# Patient Record
Sex: Female | Born: 1935 | Race: White | Hispanic: No | Smoking: Never smoker
Health system: Southern US, Community
[De-identification: ages and names within clinical notes are randomized; demographics above are authoritative.]

## PROBLEM LIST (undated history)

## (undated) DIAGNOSIS — B019 Varicella without complication: Secondary | ICD-10-CM

## (undated) DIAGNOSIS — N390 Urinary tract infection, site not specified: Secondary | ICD-10-CM

## (undated) DIAGNOSIS — T7840XA Allergy, unspecified, initial encounter: Secondary | ICD-10-CM

## (undated) DIAGNOSIS — F329 Major depressive disorder, single episode, unspecified: Secondary | ICD-10-CM

## (undated) DIAGNOSIS — E785 Hyperlipidemia, unspecified: Secondary | ICD-10-CM

## (undated) DIAGNOSIS — M1990 Unspecified osteoarthritis, unspecified site: Secondary | ICD-10-CM

## (undated) DIAGNOSIS — F32A Depression, unspecified: Secondary | ICD-10-CM

## (undated) DIAGNOSIS — K219 Gastro-esophageal reflux disease without esophagitis: Secondary | ICD-10-CM

## (undated) DIAGNOSIS — C4491 Basal cell carcinoma of skin, unspecified: Secondary | ICD-10-CM

## (undated) HISTORY — PX: OTHER SURGICAL HISTORY: SHX169

## (undated) HISTORY — DX: Urinary tract infection, site not specified: N39.0

## (undated) HISTORY — DX: Basal cell carcinoma of skin, unspecified: C44.91

## (undated) HISTORY — PX: ABDOMINAL HYSTERECTOMY: SHX81

## (undated) HISTORY — PX: APPENDECTOMY: SHX54

## (undated) HISTORY — PX: CHOLECYSTECTOMY: SHX55

## (undated) HISTORY — DX: Unspecified osteoarthritis, unspecified site: M19.90

## (undated) HISTORY — DX: Major depressive disorder, single episode, unspecified: F32.9

## (undated) HISTORY — DX: Depression, unspecified: F32.A

## (undated) HISTORY — PX: TONSILLECTOMY: SUR1361

## (undated) HISTORY — DX: Gastro-esophageal reflux disease without esophagitis: K21.9

## (undated) HISTORY — DX: Hyperlipidemia, unspecified: E78.5

## (undated) HISTORY — DX: Varicella without complication: B01.9

## (undated) HISTORY — DX: Allergy, unspecified, initial encounter: T78.40XA

---

## 1998-10-17 ENCOUNTER — Ambulatory Visit (HOSPITAL_COMMUNITY): Admission: RE | Admit: 1998-10-17 | Discharge: 1998-10-17 | Payer: Self-pay | Admitting: *Deleted

## 1998-10-17 ENCOUNTER — Encounter: Payer: Self-pay | Admitting: *Deleted

## 1998-11-12 ENCOUNTER — Other Ambulatory Visit: Admission: RE | Admit: 1998-11-12 | Discharge: 1998-11-12 | Payer: Self-pay | Admitting: *Deleted

## 1999-11-03 ENCOUNTER — Encounter: Payer: Self-pay | Admitting: *Deleted

## 1999-11-03 ENCOUNTER — Ambulatory Visit (HOSPITAL_COMMUNITY): Admission: RE | Admit: 1999-11-03 | Discharge: 1999-11-03 | Payer: Self-pay | Admitting: *Deleted

## 1999-11-16 ENCOUNTER — Other Ambulatory Visit: Admission: RE | Admit: 1999-11-16 | Discharge: 1999-11-16 | Payer: Self-pay | Admitting: *Deleted

## 2000-10-17 ENCOUNTER — Ambulatory Visit (HOSPITAL_COMMUNITY): Admission: RE | Admit: 2000-10-17 | Discharge: 2000-10-17 | Payer: Self-pay | Admitting: *Deleted

## 2000-10-17 ENCOUNTER — Encounter: Payer: Self-pay | Admitting: *Deleted

## 2000-11-17 ENCOUNTER — Other Ambulatory Visit: Admission: RE | Admit: 2000-11-17 | Discharge: 2000-11-17 | Payer: Self-pay | Admitting: *Deleted

## 2001-10-18 ENCOUNTER — Encounter: Payer: Self-pay | Admitting: *Deleted

## 2001-10-18 ENCOUNTER — Ambulatory Visit (HOSPITAL_COMMUNITY): Admission: RE | Admit: 2001-10-18 | Discharge: 2001-10-18 | Payer: Self-pay | Admitting: *Deleted

## 2001-11-06 ENCOUNTER — Other Ambulatory Visit: Admission: RE | Admit: 2001-11-06 | Discharge: 2001-11-06 | Payer: Self-pay | Admitting: *Deleted

## 2002-10-19 ENCOUNTER — Ambulatory Visit (HOSPITAL_COMMUNITY): Admission: RE | Admit: 2002-10-19 | Discharge: 2002-10-19 | Payer: Self-pay | Admitting: *Deleted

## 2002-10-19 ENCOUNTER — Encounter: Payer: Self-pay | Admitting: *Deleted

## 2003-10-21 ENCOUNTER — Ambulatory Visit (HOSPITAL_COMMUNITY): Admission: RE | Admit: 2003-10-21 | Discharge: 2003-10-21 | Payer: Self-pay | Admitting: Family Medicine

## 2004-10-16 ENCOUNTER — Emergency Department (HOSPITAL_COMMUNITY): Admission: EM | Admit: 2004-10-16 | Discharge: 2004-10-16 | Payer: Self-pay | Admitting: Emergency Medicine

## 2004-11-24 ENCOUNTER — Ambulatory Visit (HOSPITAL_COMMUNITY): Admission: RE | Admit: 2004-11-24 | Discharge: 2004-11-24 | Payer: Self-pay | Admitting: Family Medicine

## 2005-02-25 ENCOUNTER — Emergency Department (HOSPITAL_COMMUNITY): Admission: EM | Admit: 2005-02-25 | Discharge: 2005-02-25 | Payer: Self-pay | Admitting: Emergency Medicine

## 2005-09-22 ENCOUNTER — Other Ambulatory Visit: Admission: RE | Admit: 2005-09-22 | Discharge: 2005-09-22 | Payer: Self-pay | Admitting: Family Medicine

## 2005-10-04 ENCOUNTER — Encounter: Payer: Self-pay | Admitting: Gastroenterology

## 2005-11-05 ENCOUNTER — Encounter: Payer: Self-pay | Admitting: Gastroenterology

## 2005-11-25 ENCOUNTER — Ambulatory Visit (HOSPITAL_COMMUNITY): Admission: RE | Admit: 2005-11-25 | Discharge: 2005-11-25 | Payer: Self-pay | Admitting: Family Medicine

## 2006-12-01 ENCOUNTER — Ambulatory Visit (HOSPITAL_COMMUNITY): Admission: RE | Admit: 2006-12-01 | Discharge: 2006-12-01 | Payer: Self-pay | Admitting: Family Medicine

## 2007-02-02 ENCOUNTER — Ambulatory Visit (HOSPITAL_COMMUNITY): Admission: RE | Admit: 2007-02-02 | Discharge: 2007-02-02 | Payer: Self-pay | Admitting: Family Medicine

## 2007-02-13 ENCOUNTER — Other Ambulatory Visit: Admission: RE | Admit: 2007-02-13 | Discharge: 2007-02-13 | Payer: Self-pay | Admitting: Interventional Radiology

## 2007-02-13 ENCOUNTER — Encounter: Admission: RE | Admit: 2007-02-13 | Discharge: 2007-02-13 | Payer: Self-pay | Admitting: Family Medicine

## 2007-02-13 ENCOUNTER — Encounter (INDEPENDENT_AMBULATORY_CARE_PROVIDER_SITE_OTHER): Payer: Self-pay | Admitting: *Deleted

## 2007-12-04 ENCOUNTER — Ambulatory Visit (HOSPITAL_COMMUNITY): Admission: RE | Admit: 2007-12-04 | Discharge: 2007-12-04 | Payer: Self-pay | Admitting: Family Medicine

## 2008-06-28 IMAGING — US US BIOPSY
1 series · 12 of 12 positions shown · non-contrast
Comparison: none

CLINICAL DATA: Right thyroid cystic nodule with hoarseness.  Request has been made for fine needle aspiration. 
 ULTRASOUND GUIDED FINE NEEDLE ASPIRATION, RIGHT LOBE OF THYROID:

[Series 1: us biopsy · 12 acquisitions, 12 frames shown]
[im 1/12]
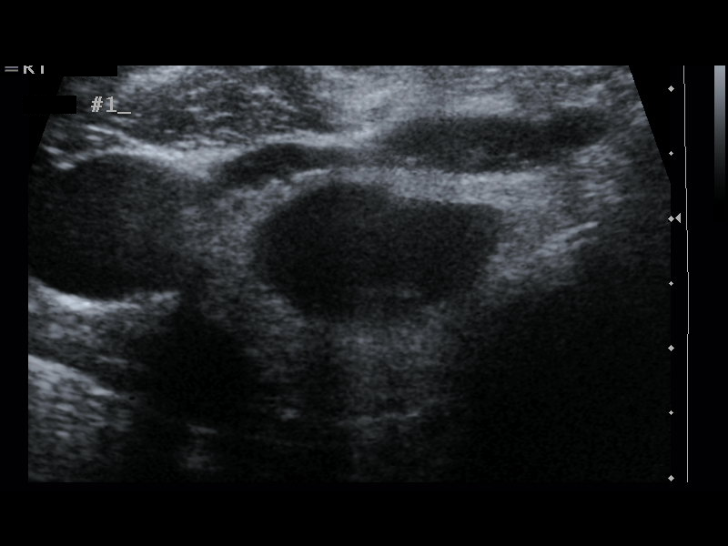
[im 2/12]
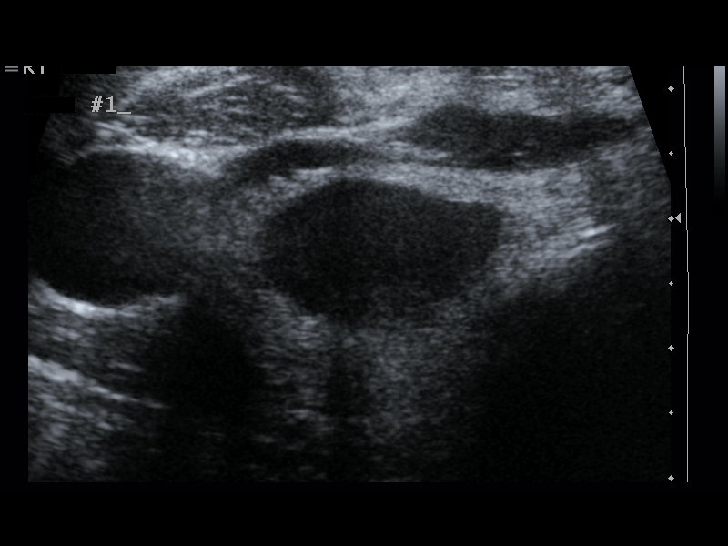
[im 3/12]
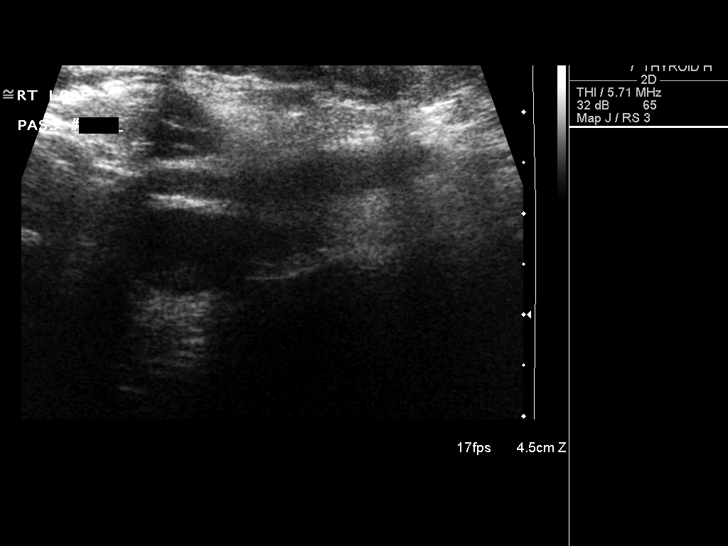
[im 4/12]
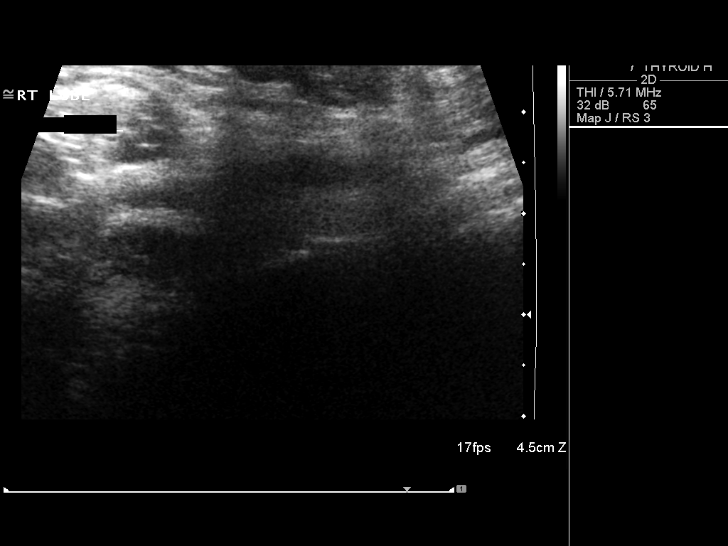
[im 5/12]
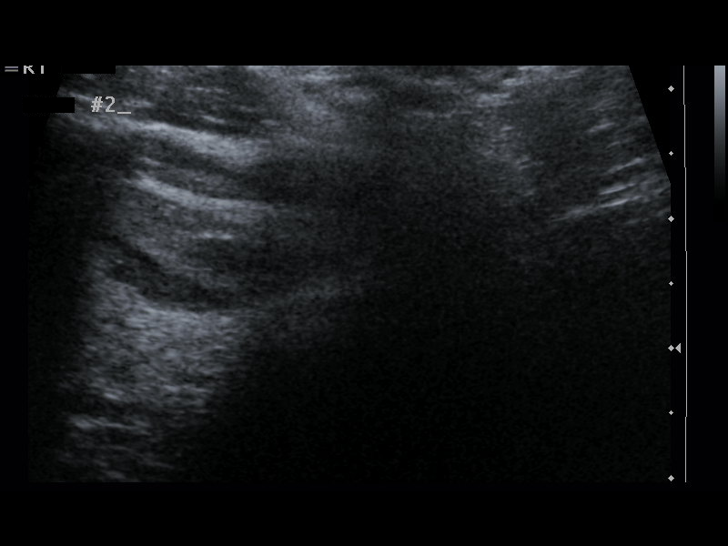
[im 6/12]
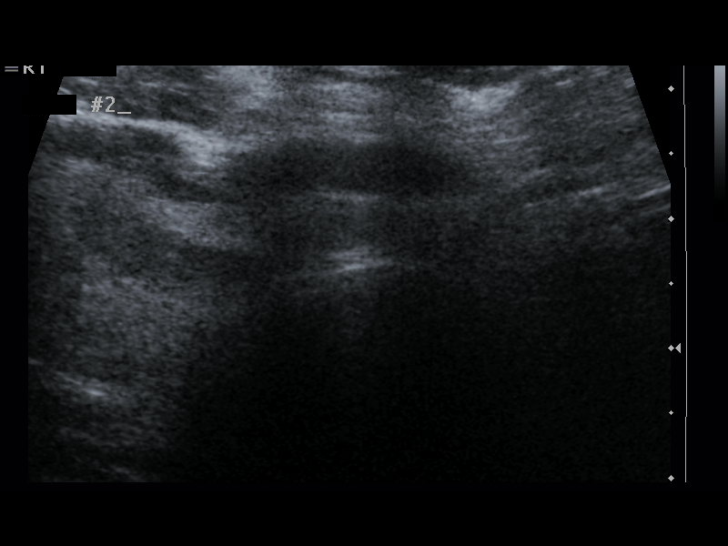
[im 7/12]
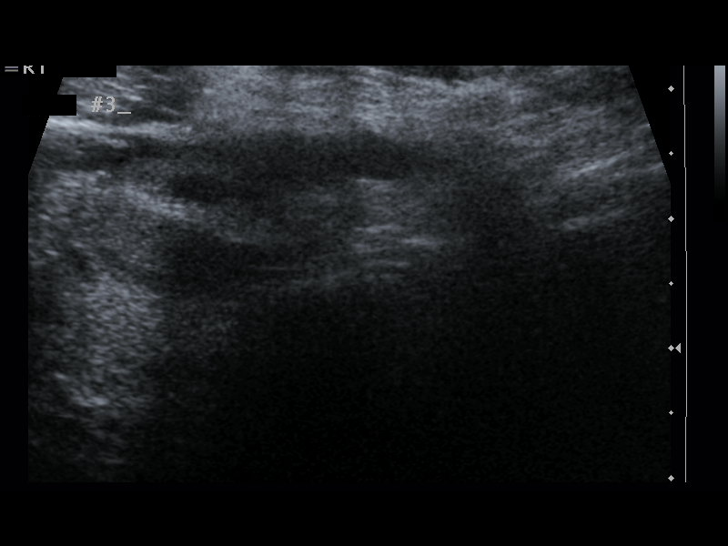
[im 8/12]
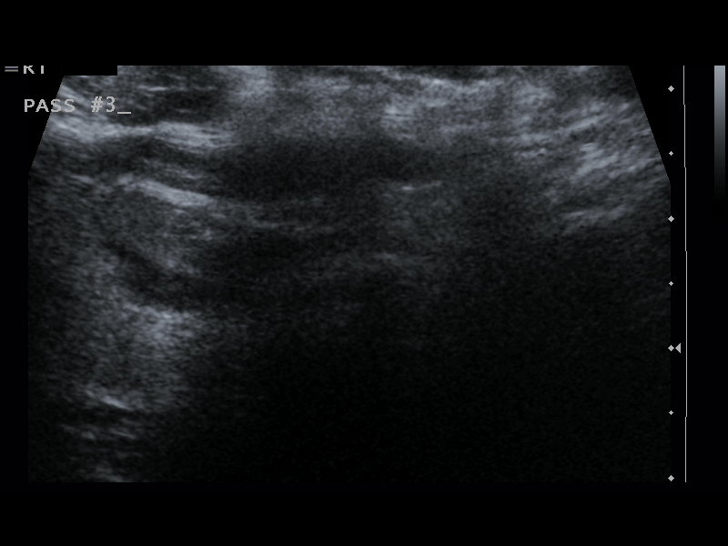
[im 9/12]
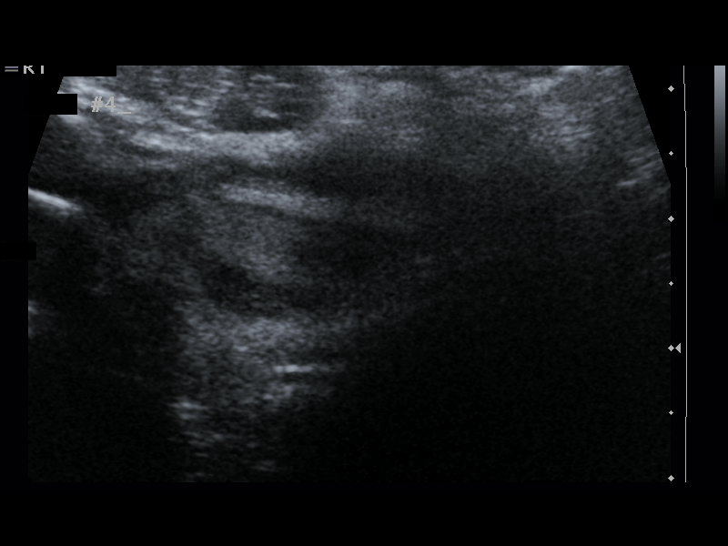
[im 10/12]
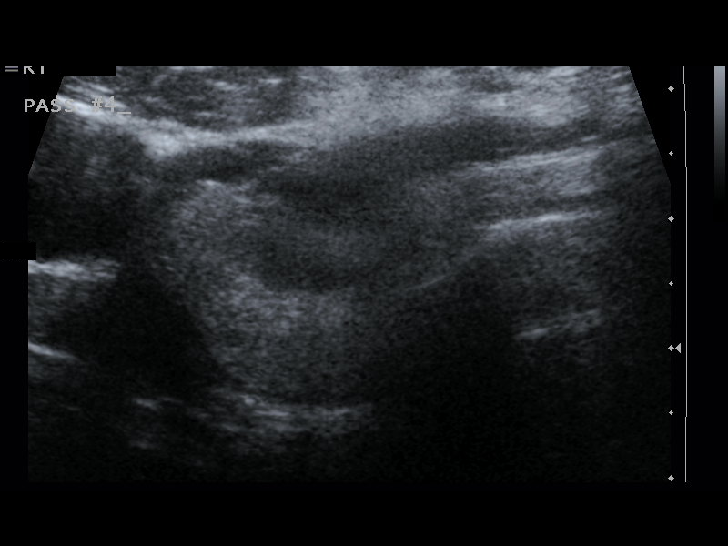
[im 11/12]
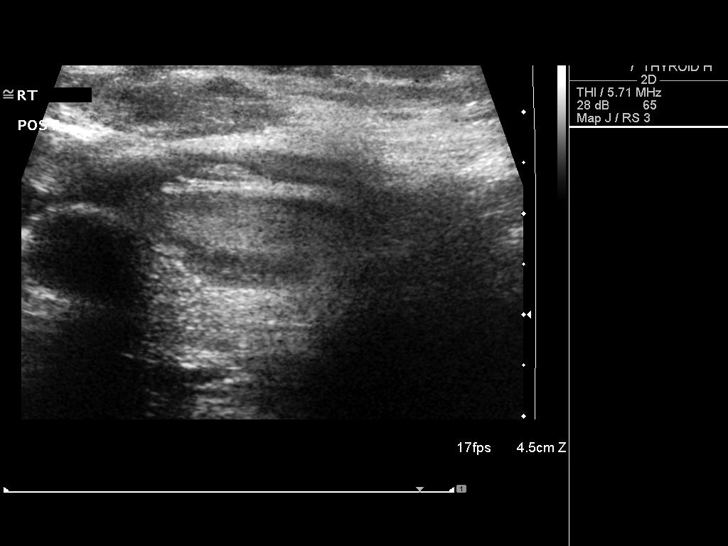
[im 12/12]
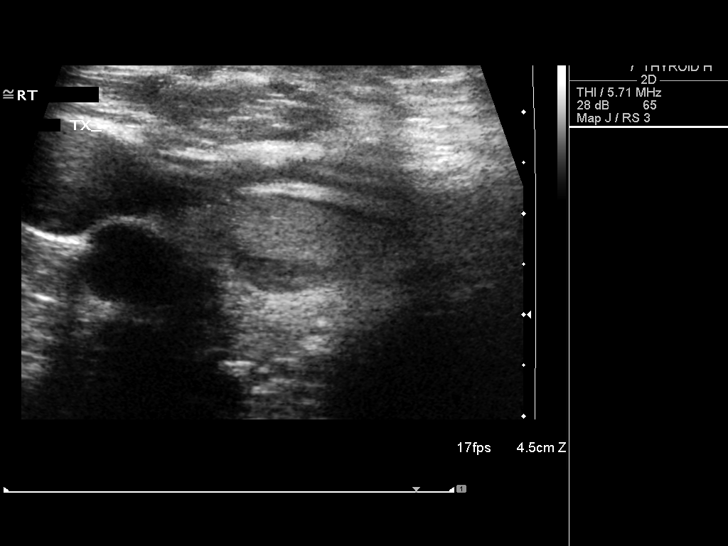

[12 of 12 positions shown; findings below may reference images not displayed]

FINDINGS: The above procedure was thoroughly discussed with the patient and written informed consent was obtained.
 Ultrasound was then performed to localize and mark an adequate site for the biopsy.  The patient was then prepped and draped in a normal sterile fashion, 1% Lidocaine was used for local anesthesia.  Using direct ultrasound guidance, three passes were made using a 25 gauge hypodermic needle into the nodule located within the right lobe of the thyroid.  Ultrasound confirmed placement of the needle on all three occasions.  A 21 gauge needle was inserted aspirating approximately 3 to 5 cc of brown serous fluid.  
 The specimens were given to pathology for further analysis.  Post procedure imaging demonstrated no hematoma or immediate complication. The patient tolerated the procedure well.
IMPRESSION: Successful ultrasound guided fine needle aspiration, nodule, right lobe of the thyroid.  Final pathology pending.

## 2008-12-04 ENCOUNTER — Ambulatory Visit (HOSPITAL_COMMUNITY): Admission: RE | Admit: 2008-12-04 | Discharge: 2008-12-04 | Payer: Self-pay | Admitting: Family Medicine

## 2009-12-08 ENCOUNTER — Ambulatory Visit (HOSPITAL_COMMUNITY): Admission: RE | Admit: 2009-12-08 | Discharge: 2009-12-08 | Payer: Self-pay | Admitting: Family Medicine

## 2010-10-27 ENCOUNTER — Encounter: Payer: Self-pay | Admitting: Gastroenterology

## 2010-11-02 ENCOUNTER — Telehealth: Payer: Self-pay | Admitting: Gastroenterology

## 2010-12-10 ENCOUNTER — Encounter (INDEPENDENT_AMBULATORY_CARE_PROVIDER_SITE_OTHER): Payer: Self-pay | Admitting: *Deleted

## 2010-12-11 ENCOUNTER — Ambulatory Visit
Admission: RE | Admit: 2010-12-11 | Discharge: 2010-12-11 | Payer: Self-pay | Source: Home / Self Care | Attending: Gastroenterology | Admitting: Gastroenterology

## 2010-12-16 ENCOUNTER — Ambulatory Visit (HOSPITAL_COMMUNITY)
Admission: RE | Admit: 2010-12-16 | Discharge: 2010-12-16 | Payer: Self-pay | Source: Home / Self Care | Attending: Family Medicine | Admitting: Family Medicine

## 2010-12-25 ENCOUNTER — Ambulatory Visit
Admission: RE | Admit: 2010-12-25 | Discharge: 2010-12-25 | Payer: Self-pay | Source: Home / Self Care | Attending: Gastroenterology | Admitting: Gastroenterology

## 2010-12-25 ENCOUNTER — Other Ambulatory Visit: Payer: Self-pay | Admitting: Gastroenterology

## 2011-01-01 ENCOUNTER — Encounter: Payer: Self-pay | Admitting: Gastroenterology

## 2011-01-07 NOTE — Progress Notes (Signed)
Summary: previsit letter ?'s  Phone Note Call from Patient Call back at Home Phone [phone]   Caller: Patient Call For: Dr. Russella Dar Reason for Call: Talk to Nurse Summary of Call: previsit letter ?'s Initial call taken by: Vallarie Mare,  November 02, 2010 11:42 AM  Follow-up for Phone Call        Pt has has prior colonoscopy at [facility] in 2006. Pt states she will sign a release of information when she comes in for her previsit. She states she knows she is due for her colonoscopy now b/c she did have one polyp removed and was told that it was the precancerous type. Told pt to tell the previsit nurse that she needs to sign a release the day of her nurse visit to we can get that information before her procedure. Pt agreed. Follow-up by: Christie Nottingham CMA Duncan Dull),  November 02, 2010 11:57 AM

## 2011-01-07 NOTE — Letter (Signed)
Summary: Pre Visit Letter Revised  [facility]  [address] [place], [place] [address]   Phone: [phone]  Fax: [phone]        10/27/2010 MRN: [id number]  Courtney Williamson [address] [place], [place]  [address]             Procedure Date:  12-25-10 9:30am           Dr Russella Dar   Welcome to the Gastroenterology Division at [facility].    You are scheduled to see a nurse for your pre-procedure visit on 12-11-10 at 10am on the 3rd floor at [facility], [address]  We ask that you try to arrive at our office 15 minutes prior to your appointment time to allow for check-in.  Please take a minute to review the attached form.  If you answer "Yes" to one or more of the questions on the first page, we ask that you call the person listed at your earliest opportunity.  If you answer "No" to all of the questions, please complete the rest of the form and bring it to your appointment.    Your nurse visit will consist of discussing your medical and surgical history, your immediate family medical history, and your medications.   If you are unable to list all of your medications on the form, please bring the medication bottles to your appointment and we will list them.  We will need to be aware of both prescribed and over the counter drugs.  We will need to know exact dosage information as well.    Please be prepared to read and sign documents such as consent forms, a financial agreement, and acknowledgement forms.  If necessary, and with your consent, a friend or relative is welcome to sit-in on the nurse visit with you.  Please bring your insurance card so that we may make a copy of it.  If your insurance requires a referral to see a specialist, please bring your referral form from your primary care physician.  No co-pay is required for this nurse visit.     If you cannot keep your appointment, please call [phone] to cancel or reschedule prior to your appointment  date.  This allows [place] the opportunity to schedule an appointment for another patient in need of care.    Thank you for choosing [facility] for your medical needs.  We appreciate the opportunity to care for you.  Please visit [place] at our website  to learn more about our practice.  Sincerely, The Gastroenterology Division

## 2011-01-07 NOTE — Miscellaneous (Signed)
Summary: previsit prep/rm  Clinical Lists Changes  Medications: Added new medication of MOVIPREP 100 GM  SOLR (PEG-KCL-NACL-NASULF-NA ASC-C) As per prep instructions. - Signed Rx of MOVIPREP 100 GM  SOLR (PEG-KCL-NACL-NASULF-NA ASC-C) As per prep instructions.;  #1 x 0;  Signed;  Entered by: Sherren Kerns RN;  Authorized by: Meryl Dare MD [facility];  Method used: [facility]  [address] [place], [place]  [address], Ph: [phone] or [phone], Fax: [phone] Allergies: Added new allergy or adverse reaction of * LATEX Added new allergy or adverse reaction of TETRACYCLINE Added new allergy or adverse reaction of MACROBID Observations: Added new observation of NKA: F (12/11/2010 9:45)    Prescriptions: MOVIPREP 100 GM  SOLR (PEG-KCL-NACL-NASULF-NA ASC-C) As per prep instructions.  #1 x 0   Entered by:   Sherren Kerns RN   Authorized by:   Meryl Dare MD [facility]   Signed by:   Sherren Kerns RN on 12/11/2010   Method used:   [facility]        [facility]  [address] (retail)       [address]       [place], [place]  [address]       Ph: [phone] or [phone]       Fax: [phone]   RxID:   [id number]

## 2011-01-07 NOTE — Letter (Addendum)
Summary: Patient Notice- Polyp Results  [facility]  [address] [place], [place] [address]   Phone: [phone]  Fax: [phone]        January 01, 2011 MRN: [id number]    [place] [address] [place], [place]  [address]    Dear Ms. Weipert,  I am pleased to inform you that the colon polyp(s) removed during your recent colonoscopy was (were) found to be benign (no cancer detected) upon pathologic examination.  I recommend you have a repeat colonoscopy examination in 5 years to look for recurrent polyps, as having colon polyps increases your risk for having recurrent polyps or even colon cancer in the future.  Should you develop new or worsening symptoms of abdominal pain, bowel habit changes or bleeding from the rectum or bowels, please schedule an evaluation with either your primary care physician or with me.  Continue treatment plan as outlined the day of your exam.  Please call us if you are having persistent problems or have questions about your condition that have not been fully answered at this time.  Sincerely,  Meryl Dare MD [facility]  This letter has been electronically signed by your physician.  Appended Document: Patient Notice- Polyp Results LETTER MAILED

## 2011-01-07 NOTE — Letter (Signed)
Summary: Moviprep Instructions  [facility]  [address]   [place], [place] [address]   Phone: [phone]  Fax: [phone]       Courtney Williamson    [birth year]    MRN: [id number]        Procedure Day Dorna Bloom: Friday, 12-25-10     Arrival Time: 8:30 a.m.     Procedure Time: 9:30 a.m.     Location of Procedure:                    x  [facility] (4th Floor)                        PREPARATION FOR COLONOSCOPY WITH MOVIPREP   Starting 5 days prior to your procedure 12-20-10 do not eat nuts, seeds, popcorn, corn, beans, peas,  salads, or any raw vegetables.  Do not take any fiber supplements (e.g. Metamucil, Citrucel, and Benefiber).  THE DAY BEFORE YOUR PROCEDURE         DATE:  12-24-10   DAY: Thursday  1.  Drink clear liquids the entire day-NO SOLID FOOD  2.  Do not drink anything colored red or purple.  Avoid juices with pulp.  No orange juice.  3.  Drink at least 64 oz. (8 glasses) of fluid/clear liquids during the day to prevent dehydration and help the prep work efficiently.  CLEAR LIQUIDS INCLUDE: Water Jello Ice Popsicles Tea (sugar ok, no milk/cream) Powdered fruit flavored drinks Coffee (sugar ok, no milk/cream) Gatorade Juice: apple, white grape, white cranberry  Lemonade Clear bullion, consomm, broth Carbonated beverages (any kind) Strained chicken noodle soup Hard Candy                             4.  In the morning, mix first dose of MoviPrep solution:    Empty 1 Pouch A and 1 Pouch B into the disposable container    Add lukewarm drinking water to the top line of the container. Mix to dissolve    Refrigerate (mixed solution should be used within 24 hrs)  5.  Begin drinking the prep at 5:00 p.m. The MoviPrep container is divided by 4 marks.   Every 15 minutes drink the solution down to the next mark (approximately 8 oz) until the full liter is complete.   6.  Follow completed prep with 16 oz of clear liquid of your choice  (Nothing red or purple).  Continue to drink clear liquids until bedtime.  7.  Before going to bed, mix second dose of MoviPrep solution:    Empty 1 Pouch A and 1 Pouch B into the disposable container    Add lukewarm drinking water to the top line of the container. Mix to dissolve    Refrigerate  THE DAY OF YOUR PROCEDURE      DATE: 12-25-10  DAY: Friday  Beginning at 4:30 a.m. (5 hours before procedure):         1. Every 15 minutes, drink the solution down to the next mark (approx 8 oz) until the full liter is complete.  2. Follow completed prep with 16 oz. of clear liquid of your choice.    3. You may drink clear liquids until 7:30 a.m. (2 HOURS BEFORE PROCEDURE).   MEDICATION INSTRUCTIONS  Unless otherwise instructed, you should take regular prescription medications with a small sip of water   as early as possible the  morning of your procedure.         OTHER INSTRUCTIONS  You will need a responsible adult at least 76 years of age to accompany you and drive you home.   This person must remain in the waiting room during your procedure.  Wear loose fitting clothing that is easily removed.  Leave jewelry and other valuables at home.  However, you may wish to bring a book to read or  an iPod/MP3 player to listen to music as you wait for your procedure to start.  Remove all body piercing jewelry and leave at home.  Total time from sign-in until discharge is approximately 2-3 hours.  You should go home directly after your procedure and rest.  You can resume normal activities the  day after your procedure.  The day of your procedure you should not:   Drive   Make legal decisions   Operate machinery   Drink alcohol   Return to work  You will receive specific instructions about eating, activities and medications before you leave.    The above instructions have been reviewed and explained to me by   Sherren Kerns RN  December 11, 2010 10:06 AM   I fully  understand and can verbalize these instructions _____________________________ Date _________

## 2011-01-07 NOTE — Procedures (Addendum)
Summary: Colonoscopy  Patient: [name] Note: All result statuses are Final unless otherwise noted.  Tests: (1) Colonoscopy (COL)   COL Colonoscopy           DONE     [facility]     [address]     [place], [place]  [address]           COLONOSCOPY PROCEDURE REPORT           PATIENT:  Courtney Williamson, Courtney Williamson  MR#:  [id number]     BIRTHDATE:  Feb 17, 1934, 76 yrs. old  GENDER:  female     ENDOSCOPIST:  Judie Petit T. Russella Dar, MD, [facility]     Referred by:  Holley Bouche, M.D.     PROCEDURE DATE:  12/25/2010     PROCEDURE:  Colonoscopy with biopsy     ASA CLASS:  Class II     INDICATIONS:  1) surveillance and high-risk screening  2) history     of pre-cancerous (adenomatous) colon polyps: 11/2005.     MEDICATIONS:   Fentanyl 50 mcg IV, Versed 7 mg IV     DESCRIPTION OF PROCEDURE:   After the risks benefits and     alternatives of the procedure were thoroughly explained, informed     consent was obtained.  Digital rectal exam was performed and     revealed no abnormalities.   The LB [id number]-[id number] endoscope     was introduced through the anus and advanced to the cecum, which     was identified by both the appendix and ileocecal valve, without     limitations.  The quality of the prep was excellent, using     MoviPrep.  The instrument was then slowly withdrawn as the colon     was fully examined.     <<PROCEDUREIMAGES>>     FINDINGS:  Two polyps were found in the cecum. They were 3 - 4 mm     in size. The polyps were removed using cold biopsy forceps. Mild     diverticulosis was found in the sigmoid colon.  A normal appearing     ileocecal valve, and appendiceal orifice were identified. The     ascending, hepatic flexure, transverse, splenic flexure,     descending colon, and rectum appeared unremarkable. Retroflexed     views in the rectum revealed no abnormalities.    The time to     cecum =  3  minutes. The scope was then withdrawn (time =  9.75     min) from the patient and  the procedure completed.           COMPLICATIONS:  None           ENDOSCOPIC IMPRESSION:     1) 3 - 4 mm, Two polyps in the cecum     2) Mild diverticulosis in the sigmoid colon           RECOMMENDATIONS:     1) Await pathology results     2) High fiber diet with liberal fluid intake     3) Repeat Colonoscopy in 5 years pending pathology review           Syncere Eble T. Russella Dar, MD, [facility]           n.     eSIGNED:   Venita Lick. Laura-Lee Villegas at 12/25/2010 09:49 AM           Melvyn Neth, [place], [id number]  Note: An exclamation mark (!) indicates a result  that was not dispersed into the flowsheet. Document Creation Date: 12/25/2010 9:49 AM _______________________________________________________________________  (1) Order result status: Final Collection or observation date-time: 12/25/2010 09:44 Requested date-time:  Receipt date-time:  Reported date-time:  Referring Physician:   Ordering Physician: Claudette Head [phone]) Specimen Source:  Source: [facility]ill Order Number: [id number] Lab site:   Appended Document: Colonoscopy     Procedures Next Due Date:    Colonoscopy: 12/2015

## 2011-01-13 NOTE — Procedures (Signed)
Summary: Colonoscopy/[facility]  Colonoscopy/[facility]   Imported By: Sherian Rein 01/08/2011 10:35:00  _____________________________________________________________________  External Attachment:    Type:   Image     Comment:   External Document

## 2011-01-13 NOTE — Letter (Signed)
Summary: [facility]  [facility]   Imported By: Sherian Rein 01/08/2011 10:34:05  _____________________________________________________________________  External Attachment:    Type:   Image     Comment:   External Document

## 2014-03-20 ENCOUNTER — Encounter: Payer: Self-pay | Admitting: Internal Medicine

## 2014-03-20 ENCOUNTER — Ambulatory Visit (INDEPENDENT_AMBULATORY_CARE_PROVIDER_SITE_OTHER): Payer: Commercial Managed Care - HMO | Admitting: Internal Medicine

## 2014-03-20 VITALS — BP 106/72 | HR 63 | Temp 97.6°F | Ht 64.5 in | Wt 160.2 lb

## 2014-03-20 DIAGNOSIS — N390 Urinary tract infection, site not specified: Secondary | ICD-10-CM

## 2014-03-20 DIAGNOSIS — R300 Dysuria: Secondary | ICD-10-CM

## 2014-03-20 LAB — POCT URINALYSIS DIPSTICK
Bilirubin, UA: NEGATIVE
Glucose, UA: NEGATIVE
Ketones, UA: NEGATIVE
PH UA: 6
Protein, UA: NEGATIVE
Spec Grav, UA: 1.01
UROBILINOGEN UA: 0.2

## 2014-03-20 MED ORDER — CIPROFLOXACIN HCL 500 MG PO TABS: 500.0000 mg | ORAL_TABLET | Freq: Two times a day (BID) | ORAL | Status: DC

## 2014-03-20 NOTE — Patient Instructions (Addendum)
Urinary Tract Infection  Urinary tract infections (UTIs) can develop anywhere along your urinary tract. Your urinary tract is your body's drainage system for removing wastes and extra water. Your urinary tract includes two kidneys, two ureters, a bladder, and a urethra. Your kidneys are a pair of bean-shaped organs. Each kidney is about the size of your fist. They are located below your ribs, one on each side of your spine.  CAUSES  Infections are caused by microbes, which are microscopic organisms, including fungi, viruses, and bacteria. These organisms are so small that they can only be seen through a microscope. Bacteria are the microbes that most commonly cause UTIs.  SYMPTOMS   Symptoms of UTIs may vary by age and gender of the patient and by the location of the infection. Symptoms in young women typically include a frequent and intense urge to urinate and a painful, burning feeling in the bladder or urethra during urination. Older women and men are more likely to be tired, shaky, and weak and have muscle aches and abdominal pain. A fever may mean the infection is in your kidneys. Other symptoms of a kidney infection include pain in your back or sides below the ribs, nausea, and vomiting.  DIAGNOSIS  To diagnose a UTI, your caregiver will ask you about your symptoms. Your caregiver also will ask to provide a urine sample. The urine sample will be tested for bacteria and white blood cells. White blood cells are made by your body to help fight infection.  TREATMENT   Typically, UTIs can be treated with medication. Because most UTIs are caused by a bacterial infection, they usually can be treated with the use of antibiotics. The choice of antibiotic and length of treatment depend on your symptoms and the type of bacteria causing your infection.  HOME CARE INSTRUCTIONS   If you were prescribed antibiotics, take them exactly as your caregiver instructs you. Finish the medication even if you feel better after you  have only taken some of the medication.   Drink enough water and fluids to keep your urine clear or pale yellow.   Avoid caffeine, tea, and carbonated beverages. They tend to irritate your bladder.   Empty your bladder often. Avoid holding urine for long periods of time.   Empty your bladder before and after sexual intercourse.   After a bowel movement, women should cleanse from front to back. Use each tissue only once.  SEEK MEDICAL CARE IF:    You have back pain.   You develop a fever.   Your symptoms do not begin to resolve within 3 days.  SEEK IMMEDIATE MEDICAL CARE IF:    You have severe back pain or lower abdominal pain.   You develop chills.   You have nausea or vomiting.   You have continued burning or discomfort with urination.  MAKE SURE YOU:    Understand these instructions.   Will watch your condition.   Will get help right away if you are not doing well or get worse.  Document Released: 09/01/2005 Document Revised: 05/23/2012 Document Reviewed: 12/31/2011  Exit[employer] Patient Information 2014 [employer].

## 2014-03-20 NOTE — Progress Notes (Signed)
Pre visit review using our clinic review tool, if applicable. No additional management support is needed unless otherwise documented below in the visit note. 

## 2014-03-20 NOTE — Progress Notes (Signed)
HPI  Courtney Williamson presents to the clinic today to establish care. She is transferring care from [facility], Dr. Kenton Kingfisher.  She does have some concerns today about frequency, urgency and dysuria. This started 2 days ago. She denies fever, chills or back pain. She reports she typically has a UTI about every 3 months.  Flu: 09/2013 Tetanus: 2005 Pneumovax: had one, unsure of date Zostovax: never Mammogram: no longer screening Pap Smear: no longer screening Bone Density: never Colonoscopy: 2012 Eye Doctor: yearly Dentist: as needed   Past Medical History  Diagnosis Date  . Chicken pox   . Depression   . Allergy   . GERD (gastroesophageal reflux disease)   . Hyperlipidemia   . Urinary tract infection, chronic     Current Outpatient Prescriptions  Medication Sig Dispense Refill  . atorvastatin (LIPITOR) 10 MG tablet Take 10 mg by mouth. Every other day      . esomeprazole (NEXIUM) 20 MG capsule Take 20 mg by mouth daily at 12 noon.      . triamcinolone (NASACORT ALLERGY 24HR) 55 MCG/ACT AERO nasal inhaler Place 2 sprays into the nose daily.       No current facility-administered medications for this visit.    Allergies  Allergen Reactions  . Latex     REACTION: breakout  . Nitrofurantoin     REACTION: whelps  . Sulfa Antibiotics Hives  . Tetracycline     REACTION: whelps    Family History  Problem Relation Age of Onset  . Arthritis Mother   . Hypertension Mother   . Arthritis Father     History   Social History  . Marital Status: Married    Spouse Name: N/A    Number of Children: N/A  . Years of Education: N/A   Occupational History  . Not on file.   Social History Main Topics  . Smoking status: Never Smoker   . Smokeless tobacco: Not on file  . Alcohol Use: No  . Drug Use: Not on file  . Sexual Activity: Not on file   Other Topics Concern  . Not on file   Social History Narrative  . No narrative on file    ROS:  Constitutional: Denies fever, malaise,  fatigue, headache or abrupt weight changes.  Respiratory: Denies difficulty breathing, shortness of breath, cough or sputum production.   Cardiovascular: Denies chest pain, chest tightness, palpitations or swelling in the hands or feet.  GU: Courtney Williamson reports urgency, frequency and dysuria. Denies blood in urine, odor or discharge.   No other specific complaints in a complete review of systems (except as listed in HPI above).  PE:  BP 106/72  Pulse 63  Temp(Src) 97.6 F (36.4 C) (Oral)  Ht 5' 4.5" (1.638 m)  Wt 160 lb 4 oz (72.689 kg)  BMI 27.09 kg/m2  SpO2 97% Wt Readings from Last 3 Encounters:  03/20/14 160 lb 4 oz (72.689 kg)    General: Appears her stated age, well developed, well nourished in NAD. Cardiovascular: Normal rate and rhythm. S1,S2 noted.  No murmur, rubs or gallops noted. No JVD or BLE edema. No carotid bruits noted. Pulmonary/Chest: Normal effort and positive vesicular breath sounds. No respiratory distress. No wheezes, rales or ronchi noted.  Abdomen: Soft and nontender. Normal bowel sounds, no bruits noted. No distention or masses noted. Liver, spleen and kidneys non palpable. No CVA tenderness.   Assessment and Plan:  UTI:  Urinalysis: mod leuks, trace blood eRx for Cipro BID x 7 days Increase  your fluid intake  RTC when due for your 6 month follow up

## 2014-05-02 ENCOUNTER — Encounter: Payer: Commercial Managed Care - HMO | Admitting: Internal Medicine

## 2014-08-23 ENCOUNTER — Encounter: Payer: Self-pay | Admitting: Gastroenterology

## 2015-02-04 DIAGNOSIS — R309 Painful micturition, unspecified: Secondary | ICD-10-CM | POA: Diagnosis not present

## 2015-02-04 DIAGNOSIS — N3090 Cystitis, unspecified without hematuria: Secondary | ICD-10-CM | POA: Diagnosis not present

## 2015-03-13 DIAGNOSIS — N3000 Acute cystitis without hematuria: Secondary | ICD-10-CM | POA: Diagnosis not present

## 2015-03-13 DIAGNOSIS — S0081XA Abrasion of other part of head, initial encounter: Secondary | ICD-10-CM | POA: Diagnosis not present

## 2015-03-24 DIAGNOSIS — H524 Presbyopia: Secondary | ICD-10-CM | POA: Diagnosis not present

## 2015-03-24 DIAGNOSIS — H2513 Age-related nuclear cataract, bilateral: Secondary | ICD-10-CM | POA: Diagnosis not present

## 2015-05-07 DIAGNOSIS — N3000 Acute cystitis without hematuria: Secondary | ICD-10-CM | POA: Diagnosis not present

## 2015-05-07 DIAGNOSIS — E780 Pure hypercholesterolemia: Secondary | ICD-10-CM | POA: Diagnosis not present

## 2015-05-07 DIAGNOSIS — N183 Chronic kidney disease, stage 3 (moderate): Secondary | ICD-10-CM | POA: Diagnosis not present

## 2015-05-07 DIAGNOSIS — K219 Gastro-esophageal reflux disease without esophagitis: Secondary | ICD-10-CM | POA: Diagnosis not present

## 2015-05-16 DIAGNOSIS — M545 Low back pain: Secondary | ICD-10-CM | POA: Diagnosis not present

## 2015-05-16 DIAGNOSIS — M9903 Segmental and somatic dysfunction of lumbar region: Secondary | ICD-10-CM | POA: Diagnosis not present

## 2015-05-19 DIAGNOSIS — M9903 Segmental and somatic dysfunction of lumbar region: Secondary | ICD-10-CM | POA: Diagnosis not present

## 2015-05-19 DIAGNOSIS — M545 Low back pain: Secondary | ICD-10-CM | POA: Diagnosis not present

## 2015-05-21 DIAGNOSIS — M9903 Segmental and somatic dysfunction of lumbar region: Secondary | ICD-10-CM | POA: Diagnosis not present

## 2015-05-21 DIAGNOSIS — M545 Low back pain: Secondary | ICD-10-CM | POA: Diagnosis not present

## 2015-05-26 DIAGNOSIS — M545 Low back pain: Secondary | ICD-10-CM | POA: Diagnosis not present

## 2015-05-26 DIAGNOSIS — M9903 Segmental and somatic dysfunction of lumbar region: Secondary | ICD-10-CM | POA: Diagnosis not present

## 2015-06-16 DIAGNOSIS — R351 Nocturia: Secondary | ICD-10-CM | POA: Diagnosis not present

## 2015-06-16 DIAGNOSIS — N3020 Other chronic cystitis without hematuria: Secondary | ICD-10-CM | POA: Diagnosis not present

## 2015-06-30 DIAGNOSIS — R350 Frequency of micturition: Secondary | ICD-10-CM | POA: Diagnosis not present

## 2015-06-30 DIAGNOSIS — N3020 Other chronic cystitis without hematuria: Secondary | ICD-10-CM | POA: Diagnosis not present

## 2015-06-30 DIAGNOSIS — D179 Benign lipomatous neoplasm, unspecified: Secondary | ICD-10-CM | POA: Diagnosis not present

## 2015-07-08 DIAGNOSIS — D179 Benign lipomatous neoplasm, unspecified: Secondary | ICD-10-CM | POA: Diagnosis not present

## 2015-07-08 DIAGNOSIS — Z9049 Acquired absence of other specified parts of digestive tract: Secondary | ICD-10-CM | POA: Diagnosis not present

## 2015-07-08 DIAGNOSIS — K7689 Other specified diseases of liver: Secondary | ICD-10-CM | POA: Diagnosis not present

## 2015-07-17 DIAGNOSIS — N3020 Other chronic cystitis without hematuria: Secondary | ICD-10-CM | POA: Diagnosis not present

## 2015-08-21 DIAGNOSIS — M545 Low back pain: Secondary | ICD-10-CM | POA: Diagnosis not present

## 2015-08-21 DIAGNOSIS — M9903 Segmental and somatic dysfunction of lumbar region: Secondary | ICD-10-CM | POA: Diagnosis not present

## 2015-08-28 DIAGNOSIS — M9903 Segmental and somatic dysfunction of lumbar region: Secondary | ICD-10-CM | POA: Diagnosis not present

## 2015-08-28 DIAGNOSIS — M545 Low back pain: Secondary | ICD-10-CM | POA: Diagnosis not present

## 2015-09-04 DIAGNOSIS — R350 Frequency of micturition: Secondary | ICD-10-CM | POA: Diagnosis not present

## 2015-09-04 DIAGNOSIS — N3020 Other chronic cystitis without hematuria: Secondary | ICD-10-CM | POA: Diagnosis not present

## 2015-11-06 DIAGNOSIS — E7800 Pure hypercholesterolemia, unspecified: Secondary | ICD-10-CM | POA: Diagnosis not present

## 2015-11-06 DIAGNOSIS — K219 Gastro-esophageal reflux disease without esophagitis: Secondary | ICD-10-CM | POA: Diagnosis not present

## 2015-11-06 DIAGNOSIS — N390 Urinary tract infection, site not specified: Secondary | ICD-10-CM | POA: Diagnosis not present

## 2015-11-06 DIAGNOSIS — N183 Chronic kidney disease, stage 3 (moderate): Secondary | ICD-10-CM | POA: Diagnosis not present

## 2015-12-03 DIAGNOSIS — R300 Dysuria: Secondary | ICD-10-CM | POA: Diagnosis not present

## 2015-12-03 DIAGNOSIS — N390 Urinary tract infection, site not specified: Secondary | ICD-10-CM | POA: Diagnosis not present

## 2015-12-29 ENCOUNTER — Encounter: Payer: Self-pay | Admitting: Gastroenterology

## 2016-01-02 DIAGNOSIS — Z0000 Encounter for general adult medical examination without abnormal findings: Secondary | ICD-10-CM | POA: Diagnosis not present

## 2016-01-02 DIAGNOSIS — N390 Urinary tract infection, site not specified: Secondary | ICD-10-CM | POA: Diagnosis not present

## 2016-01-02 DIAGNOSIS — R350 Frequency of micturition: Secondary | ICD-10-CM | POA: Diagnosis not present

## 2016-01-06 DIAGNOSIS — N390 Urinary tract infection, site not specified: Secondary | ICD-10-CM | POA: Diagnosis not present

## 2016-01-06 DIAGNOSIS — B9620 Unspecified Escherichia coli [E. coli] as the cause of diseases classified elsewhere: Secondary | ICD-10-CM | POA: Diagnosis not present

## 2016-01-07 ENCOUNTER — Encounter: Payer: Self-pay | Admitting: Gastroenterology

## 2016-01-07 DIAGNOSIS — B9620 Unspecified Escherichia coli [E. coli] as the cause of diseases classified elsewhere: Secondary | ICD-10-CM | POA: Diagnosis not present

## 2016-01-07 DIAGNOSIS — N390 Urinary tract infection, site not specified: Secondary | ICD-10-CM | POA: Diagnosis not present

## 2016-01-08 DIAGNOSIS — B9620 Unspecified Escherichia coli [E. coli] as the cause of diseases classified elsewhere: Secondary | ICD-10-CM | POA: Diagnosis not present

## 2016-01-08 DIAGNOSIS — N390 Urinary tract infection, site not specified: Secondary | ICD-10-CM | POA: Diagnosis not present

## 2016-01-09 DIAGNOSIS — B9620 Unspecified Escherichia coli [E. coli] as the cause of diseases classified elsewhere: Secondary | ICD-10-CM | POA: Diagnosis not present

## 2016-01-09 DIAGNOSIS — N390 Urinary tract infection, site not specified: Secondary | ICD-10-CM | POA: Diagnosis not present

## 2016-01-28 DIAGNOSIS — R350 Frequency of micturition: Secondary | ICD-10-CM | POA: Diagnosis not present

## 2016-01-28 DIAGNOSIS — N390 Urinary tract infection, site not specified: Secondary | ICD-10-CM | POA: Diagnosis not present

## 2016-01-28 DIAGNOSIS — Z0000 Encounter for general adult medical examination without abnormal findings: Secondary | ICD-10-CM | POA: Diagnosis not present

## 2016-01-28 DIAGNOSIS — N3020 Other chronic cystitis without hematuria: Secondary | ICD-10-CM | POA: Diagnosis not present

## 2016-02-18 DIAGNOSIS — Z0000 Encounter for general adult medical examination without abnormal findings: Secondary | ICD-10-CM | POA: Diagnosis not present

## 2016-02-18 DIAGNOSIS — R350 Frequency of micturition: Secondary | ICD-10-CM | POA: Diagnosis not present

## 2016-02-18 DIAGNOSIS — N3020 Other chronic cystitis without hematuria: Secondary | ICD-10-CM | POA: Diagnosis not present

## 2016-02-24 ENCOUNTER — Ambulatory Visit (AMBULATORY_SURGERY_CENTER): Payer: Self-pay

## 2016-02-24 VITALS — Ht 64.0 in | Wt 168.0 lb

## 2016-02-24 DIAGNOSIS — Z86010 Personal history of colonic polyps: Secondary | ICD-10-CM

## 2016-02-24 MED ORDER — NA SULFATE-K SULFATE-MG SULF 17.5-3.13-1.6 GM/177ML PO SOLN: ORAL | Status: DC

## 2016-02-24 NOTE — Progress Notes (Signed)
Per pt, no allergies to soy or egg products.Pt not taking any weight loss meds or using  O2 at home. 

## 2016-03-09 ENCOUNTER — Ambulatory Visit (AMBULATORY_SURGERY_CENTER): Payer: Commercial Managed Care - HMO | Admitting: Gastroenterology

## 2016-03-09 ENCOUNTER — Encounter: Payer: Self-pay | Admitting: Gastroenterology

## 2016-03-09 VITALS — BP 156/85 | HR 75 | Temp 98.7°F | Resp 14 | Ht 64.0 in | Wt 168.0 lb

## 2016-03-09 DIAGNOSIS — Z86010 Personal history of colonic polyps: Secondary | ICD-10-CM | POA: Diagnosis present

## 2016-03-09 DIAGNOSIS — K219 Gastro-esophageal reflux disease without esophagitis: Secondary | ICD-10-CM | POA: Diagnosis not present

## 2016-03-09 DIAGNOSIS — E669 Obesity, unspecified: Secondary | ICD-10-CM | POA: Diagnosis not present

## 2016-03-09 MED ORDER — SODIUM CHLORIDE 0.9 % IV SOLN: 500.0000 mL | INTRAVENOUS | Status: DC

## 2016-03-09 NOTE — Progress Notes (Signed)
approx 0925 pt noted to be wretching  Head immediatley dropped, and airway opened.  Nothing freely coming out.  Pt oropharynx suctioned with minimal clear, drool like fluid.  No more sedation given and pt allowed to "wake up"  Situation explained to pt and pt verbalized understanding and report no pain with about 80 cm of scope left.  Checked on pt several times till end and no pain reported.  Situation relayed to PACU RN and pt again in recovery.  VSS stable thru out "incident"

## 2016-03-09 NOTE — Patient Instructions (Signed)
Discharge instructions given. Handouts on diverticulosis and a high fiber diet Resume previous medications. YOU HAD AN ENDOSCOPIC PROCEDURE TODAY AT THE [facility]:   Refer to the procedure report that was given to you for any specific questions about what was found during the examination.  If the procedure report does not answer your questions, please call your gastroenterologist to clarify.  If you requested that your care partner not be given the details of your procedure findings, then the procedure report has been included in a sealed envelope for you to review at your convenience later.  YOU SHOULD EXPECT: Some feelings of bloating in the abdomen. Passage of more gas than usual.  Walking can help get rid of the air that was put into your GI tract during the procedure and reduce the bloating. If you had a lower endoscopy (such as a colonoscopy or flexible sigmoidoscopy) you may notice spotting of blood in your stool or on the toilet paper. If you underwent a bowel prep for your procedure, you may not have a normal bowel movement for a few days.  Please Note:  You might notice some irritation and congestion in your nose or some drainage.  This is from the oxygen used during your procedure.  There is no need for concern and it should clear up in a day or so.  SYMPTOMS TO REPORT IMMEDIATELY:   Following lower endoscopy (colonoscopy or flexible sigmoidoscopy):  Excessive amounts of blood in the stool  Significant tenderness or worsening of abdominal pains  Swelling of the abdomen that is new, acute  Fever of 100F or higher   For urgent or emergent issues, a gastroenterologist can be reached at any hour by calling [phone].   DIET: Your first meal following the procedure should be a small meal and then it is ok to progress to your normal diet. Heavy or fried foods are harder to digest and may make you feel nauseous or bloated.  Likewise, meals heavy in dairy and vegetables  can increase bloating.  Drink plenty of fluids but you should avoid alcoholic beverages for 24 hours.  ACTIVITY:  You should plan to take it easy for the rest of today and you should NOT DRIVE or use heavy machinery until tomorrow (because of the sedation medicines used during the test).    FOLLOW UP: Our staff will call the number listed on your records the next business day following your procedure to check on you and address any questions or concerns that you may have regarding the information given to you following your procedure. If we do not reach you, we will leave a message.  However, if you are feeling well and you are not experiencing any problems, there is no need to return our call.  We will assume that you have returned to your regular daily activities without incident.  If any biopsies were taken you will be contacted by phone or by letter within the next 1-3 weeks.  Please call us at [phone] if you have not heard about the biopsies in 3 weeks.    SIGNATURES/CONFIDENTIALITY: You and/or your care partner have signed paperwork which will be entered into your electronic medical record.  These signatures attest to the fact that that the information above on your After Visit Summary has been reviewed and is understood.  Full responsibility of the confidentiality of this discharge information lies with you and/or your care-partner. 

## 2016-03-09 NOTE — Op Note (Signed)
[facility] Patient Name: Courtney Williamson Procedure Date: 03/09/2016 9:10 AM MRN: [id number] Endoscopist: Ladene Artist , MD Age: 82 Referring MD:  Date of Birth: 13-Dec-1933 Gender: Female Procedure:                Colonoscopy Indications:              Surveillance: Personal history of adenomatous                            polyps on last colonoscopy > 5 years ago Medicines:                Monitored Anesthesia Care Procedure:                Pre-Anesthesia Assessment:                           - Prior to the procedure, a History and Physical                            was performed, and patient medications and                            allergies were reviewed. The patient's tolerance of                            previous anesthesia was also reviewed. The risks                            and benefits of the procedure and the sedation                            options and risks were discussed with the patient.                            All questions were answered, and informed consent                            was obtained. Prior Anticoagulants: The patient has                            taken no previous anticoagulant or antiplatelet                            agents. ASA Grade Assessment: II - A patient with                            mild systemic disease. After reviewing the risks                            and benefits, the patient was deemed in                            satisfactory condition to undergo the procedure.  After obtaining informed consent, the colonoscope                            was passed under direct vision. Throughout the                            procedure, the patient's blood pressure, pulse, and                            oxygen saturations were monitored continuously. The                            Model CF-HQ190L [id number]) scope was introduced                            through the anus and advanced to the the  cecum,                            identified by appendiceal orifice and ileocecal                            valve. The colonoscopy was performed without                            difficulty. The patient tolerated the procedure                            well. The quality of the bowel preparation was                            excellent. The ileocecal valve, appendiceal                            orifice, and rectum were photographed. Scope In: 9:17:40 AM Scope Out: 9:29:24 AM Scope Withdrawal Time: 0 hours 7 minutes 24 seconds  Total Procedure Duration: 0 hours 11 minutes 44 seconds  Findings:      The digital rectal exam was normal.      A few small-mouthed diverticula were found in the sigmoid colon.      The exam was otherwise without abnormality on direct and retroflexion       views. Complications:            No immediate complications. Estimated Blood Loss:     Estimated blood loss: none. Impression:               - Diverticulosis in the sigmoid colon.                           - The examination was otherwise normal on direct                            and retroflexion views. Recommendation:           - Patient has a contact number available for  emergencies. The signs and symptoms of potential                            delayed complications were discussed with the                            patient. Return to normal activities tomorrow.                            Written discharge instructions were provided to the                            patient.                           - Resume previous diet.                           - Continue present medications.                           - No repeat colonoscopy due to age. Procedure Code(s):        --- Professional ---                           [id number], Colorectal cancer screening; colonoscopy on                            individual at high risk CPT copyright 2016 [employer] Association. All rights  reserved. Ladene Artist, MD 03/09/2016 9:35:07 AM This report has been signed electronically. Number of Addenda: 0 Referring MD:

## 2016-03-10 ENCOUNTER — Telehealth: Payer: Self-pay | Admitting: *Deleted

## 2016-03-10 NOTE — Telephone Encounter (Signed)
  Follow up Call-  Call back number 03/09/2016  Post procedure Call Back phone  # [phone]  Permission to leave phone message Yes     Patient questions:  Do you have a fever, pain , or abdominal swelling? No. Pain Score  0 *  Have you tolerated food without any problems? Yes.    Have you been able to return to your normal activities? Yes.    Do you have any questions about your discharge instructions: Diet   No. Medications  No. Follow up visit  No.  Do you have questions or concerns about your Care? No.  Actions: * If pain score is 4 or above: No action needed, pain <4.

## 2016-03-24 DIAGNOSIS — H2513 Age-related nuclear cataract, bilateral: Secondary | ICD-10-CM | POA: Diagnosis not present

## 2016-04-02 DIAGNOSIS — N3020 Other chronic cystitis without hematuria: Secondary | ICD-10-CM | POA: Diagnosis not present

## 2016-04-02 DIAGNOSIS — Z0000 Encounter for general adult medical examination without abnormal findings: Secondary | ICD-10-CM | POA: Diagnosis not present

## 2016-04-02 DIAGNOSIS — N390 Urinary tract infection, site not specified: Secondary | ICD-10-CM | POA: Diagnosis not present

## 2016-04-02 DIAGNOSIS — R350 Frequency of micturition: Secondary | ICD-10-CM | POA: Diagnosis not present

## 2016-05-07 DIAGNOSIS — K219 Gastro-esophageal reflux disease without esophagitis: Secondary | ICD-10-CM | POA: Diagnosis not present

## 2016-05-07 DIAGNOSIS — E7800 Pure hypercholesterolemia, unspecified: Secondary | ICD-10-CM | POA: Diagnosis not present

## 2016-05-07 DIAGNOSIS — N183 Chronic kidney disease, stage 3 (moderate): Secondary | ICD-10-CM | POA: Diagnosis not present

## 2016-08-02 DIAGNOSIS — J069 Acute upper respiratory infection, unspecified: Secondary | ICD-10-CM | POA: Diagnosis not present

## 2016-08-04 DIAGNOSIS — M9903 Segmental and somatic dysfunction of lumbar region: Secondary | ICD-10-CM | POA: Diagnosis not present

## 2016-08-04 DIAGNOSIS — M955 Acquired deformity of pelvis: Secondary | ICD-10-CM | POA: Diagnosis not present

## 2016-08-04 DIAGNOSIS — M5136 Other intervertebral disc degeneration, lumbar region: Secondary | ICD-10-CM | POA: Diagnosis not present

## 2016-08-04 DIAGNOSIS — M9905 Segmental and somatic dysfunction of pelvic region: Secondary | ICD-10-CM | POA: Diagnosis not present

## 2016-08-05 DIAGNOSIS — M9903 Segmental and somatic dysfunction of lumbar region: Secondary | ICD-10-CM | POA: Diagnosis not present

## 2016-08-05 DIAGNOSIS — M5136 Other intervertebral disc degeneration, lumbar region: Secondary | ICD-10-CM | POA: Diagnosis not present

## 2016-08-05 DIAGNOSIS — M9905 Segmental and somatic dysfunction of pelvic region: Secondary | ICD-10-CM | POA: Diagnosis not present

## 2016-08-05 DIAGNOSIS — M955 Acquired deformity of pelvis: Secondary | ICD-10-CM | POA: Diagnosis not present

## 2016-08-06 DIAGNOSIS — M955 Acquired deformity of pelvis: Secondary | ICD-10-CM | POA: Diagnosis not present

## 2016-08-06 DIAGNOSIS — M9905 Segmental and somatic dysfunction of pelvic region: Secondary | ICD-10-CM | POA: Diagnosis not present

## 2016-08-06 DIAGNOSIS — M9903 Segmental and somatic dysfunction of lumbar region: Secondary | ICD-10-CM | POA: Diagnosis not present

## 2016-08-06 DIAGNOSIS — M5136 Other intervertebral disc degeneration, lumbar region: Secondary | ICD-10-CM | POA: Diagnosis not present

## 2016-08-10 DIAGNOSIS — M9905 Segmental and somatic dysfunction of pelvic region: Secondary | ICD-10-CM | POA: Diagnosis not present

## 2016-08-10 DIAGNOSIS — M955 Acquired deformity of pelvis: Secondary | ICD-10-CM | POA: Diagnosis not present

## 2016-08-10 DIAGNOSIS — M5136 Other intervertebral disc degeneration, lumbar region: Secondary | ICD-10-CM | POA: Diagnosis not present

## 2016-08-10 DIAGNOSIS — M9903 Segmental and somatic dysfunction of lumbar region: Secondary | ICD-10-CM | POA: Diagnosis not present

## 2016-08-12 DIAGNOSIS — M9905 Segmental and somatic dysfunction of pelvic region: Secondary | ICD-10-CM | POA: Diagnosis not present

## 2016-08-12 DIAGNOSIS — M9903 Segmental and somatic dysfunction of lumbar region: Secondary | ICD-10-CM | POA: Diagnosis not present

## 2016-08-12 DIAGNOSIS — M5136 Other intervertebral disc degeneration, lumbar region: Secondary | ICD-10-CM | POA: Diagnosis not present

## 2016-08-12 DIAGNOSIS — M955 Acquired deformity of pelvis: Secondary | ICD-10-CM | POA: Diagnosis not present

## 2016-08-13 DIAGNOSIS — M9905 Segmental and somatic dysfunction of pelvic region: Secondary | ICD-10-CM | POA: Diagnosis not present

## 2016-08-13 DIAGNOSIS — M955 Acquired deformity of pelvis: Secondary | ICD-10-CM | POA: Diagnosis not present

## 2016-08-13 DIAGNOSIS — M5136 Other intervertebral disc degeneration, lumbar region: Secondary | ICD-10-CM | POA: Diagnosis not present

## 2016-08-13 DIAGNOSIS — M9903 Segmental and somatic dysfunction of lumbar region: Secondary | ICD-10-CM | POA: Diagnosis not present

## 2016-08-16 DIAGNOSIS — M955 Acquired deformity of pelvis: Secondary | ICD-10-CM | POA: Diagnosis not present

## 2016-08-16 DIAGNOSIS — M9903 Segmental and somatic dysfunction of lumbar region: Secondary | ICD-10-CM | POA: Diagnosis not present

## 2016-08-16 DIAGNOSIS — M5136 Other intervertebral disc degeneration, lumbar region: Secondary | ICD-10-CM | POA: Diagnosis not present

## 2016-08-16 DIAGNOSIS — M9905 Segmental and somatic dysfunction of pelvic region: Secondary | ICD-10-CM | POA: Diagnosis not present

## 2016-08-18 DIAGNOSIS — M955 Acquired deformity of pelvis: Secondary | ICD-10-CM | POA: Diagnosis not present

## 2016-08-18 DIAGNOSIS — M9903 Segmental and somatic dysfunction of lumbar region: Secondary | ICD-10-CM | POA: Diagnosis not present

## 2016-08-18 DIAGNOSIS — M5136 Other intervertebral disc degeneration, lumbar region: Secondary | ICD-10-CM | POA: Diagnosis not present

## 2016-08-18 DIAGNOSIS — M9905 Segmental and somatic dysfunction of pelvic region: Secondary | ICD-10-CM | POA: Diagnosis not present

## 2016-08-20 DIAGNOSIS — M9905 Segmental and somatic dysfunction of pelvic region: Secondary | ICD-10-CM | POA: Diagnosis not present

## 2016-08-20 DIAGNOSIS — M955 Acquired deformity of pelvis: Secondary | ICD-10-CM | POA: Diagnosis not present

## 2016-08-20 DIAGNOSIS — M5136 Other intervertebral disc degeneration, lumbar region: Secondary | ICD-10-CM | POA: Diagnosis not present

## 2016-08-20 DIAGNOSIS — M9903 Segmental and somatic dysfunction of lumbar region: Secondary | ICD-10-CM | POA: Diagnosis not present

## 2016-08-23 DIAGNOSIS — M955 Acquired deformity of pelvis: Secondary | ICD-10-CM | POA: Diagnosis not present

## 2016-08-23 DIAGNOSIS — M9905 Segmental and somatic dysfunction of pelvic region: Secondary | ICD-10-CM | POA: Diagnosis not present

## 2016-08-23 DIAGNOSIS — M5136 Other intervertebral disc degeneration, lumbar region: Secondary | ICD-10-CM | POA: Diagnosis not present

## 2016-08-23 DIAGNOSIS — M9903 Segmental and somatic dysfunction of lumbar region: Secondary | ICD-10-CM | POA: Diagnosis not present

## 2016-08-25 DIAGNOSIS — M955 Acquired deformity of pelvis: Secondary | ICD-10-CM | POA: Diagnosis not present

## 2016-08-25 DIAGNOSIS — M9905 Segmental and somatic dysfunction of pelvic region: Secondary | ICD-10-CM | POA: Diagnosis not present

## 2016-08-25 DIAGNOSIS — M9903 Segmental and somatic dysfunction of lumbar region: Secondary | ICD-10-CM | POA: Diagnosis not present

## 2016-08-25 DIAGNOSIS — M5136 Other intervertebral disc degeneration, lumbar region: Secondary | ICD-10-CM | POA: Diagnosis not present

## 2016-08-30 DIAGNOSIS — M5136 Other intervertebral disc degeneration, lumbar region: Secondary | ICD-10-CM | POA: Diagnosis not present

## 2016-08-30 DIAGNOSIS — M955 Acquired deformity of pelvis: Secondary | ICD-10-CM | POA: Diagnosis not present

## 2016-08-30 DIAGNOSIS — M9903 Segmental and somatic dysfunction of lumbar region: Secondary | ICD-10-CM | POA: Diagnosis not present

## 2016-08-30 DIAGNOSIS — M9905 Segmental and somatic dysfunction of pelvic region: Secondary | ICD-10-CM | POA: Diagnosis not present

## 2016-09-02 DIAGNOSIS — M9905 Segmental and somatic dysfunction of pelvic region: Secondary | ICD-10-CM | POA: Diagnosis not present

## 2016-09-02 DIAGNOSIS — M9903 Segmental and somatic dysfunction of lumbar region: Secondary | ICD-10-CM | POA: Diagnosis not present

## 2016-09-02 DIAGNOSIS — M5136 Other intervertebral disc degeneration, lumbar region: Secondary | ICD-10-CM | POA: Diagnosis not present

## 2016-09-02 DIAGNOSIS — M955 Acquired deformity of pelvis: Secondary | ICD-10-CM | POA: Diagnosis not present

## 2016-09-06 DIAGNOSIS — M9903 Segmental and somatic dysfunction of lumbar region: Secondary | ICD-10-CM | POA: Diagnosis not present

## 2016-09-06 DIAGNOSIS — M955 Acquired deformity of pelvis: Secondary | ICD-10-CM | POA: Diagnosis not present

## 2016-09-06 DIAGNOSIS — M9905 Segmental and somatic dysfunction of pelvic region: Secondary | ICD-10-CM | POA: Diagnosis not present

## 2016-09-06 DIAGNOSIS — M5136 Other intervertebral disc degeneration, lumbar region: Secondary | ICD-10-CM | POA: Diagnosis not present

## 2016-09-09 DIAGNOSIS — M955 Acquired deformity of pelvis: Secondary | ICD-10-CM | POA: Diagnosis not present

## 2016-09-09 DIAGNOSIS — Z23 Encounter for immunization: Secondary | ICD-10-CM | POA: Diagnosis not present

## 2016-09-09 DIAGNOSIS — M9905 Segmental and somatic dysfunction of pelvic region: Secondary | ICD-10-CM | POA: Diagnosis not present

## 2016-09-09 DIAGNOSIS — M5136 Other intervertebral disc degeneration, lumbar region: Secondary | ICD-10-CM | POA: Diagnosis not present

## 2016-09-09 DIAGNOSIS — M9903 Segmental and somatic dysfunction of lumbar region: Secondary | ICD-10-CM | POA: Diagnosis not present

## 2016-09-15 DIAGNOSIS — M9903 Segmental and somatic dysfunction of lumbar region: Secondary | ICD-10-CM | POA: Diagnosis not present

## 2016-09-15 DIAGNOSIS — M955 Acquired deformity of pelvis: Secondary | ICD-10-CM | POA: Diagnosis not present

## 2016-09-15 DIAGNOSIS — M9905 Segmental and somatic dysfunction of pelvic region: Secondary | ICD-10-CM | POA: Diagnosis not present

## 2016-09-15 DIAGNOSIS — M5136 Other intervertebral disc degeneration, lumbar region: Secondary | ICD-10-CM | POA: Diagnosis not present

## 2016-09-27 DIAGNOSIS — M9905 Segmental and somatic dysfunction of pelvic region: Secondary | ICD-10-CM | POA: Diagnosis not present

## 2016-09-27 DIAGNOSIS — M955 Acquired deformity of pelvis: Secondary | ICD-10-CM | POA: Diagnosis not present

## 2016-09-27 DIAGNOSIS — M9903 Segmental and somatic dysfunction of lumbar region: Secondary | ICD-10-CM | POA: Diagnosis not present

## 2016-09-27 DIAGNOSIS — M5136 Other intervertebral disc degeneration, lumbar region: Secondary | ICD-10-CM | POA: Diagnosis not present

## 2016-09-29 DIAGNOSIS — M955 Acquired deformity of pelvis: Secondary | ICD-10-CM | POA: Diagnosis not present

## 2016-09-29 DIAGNOSIS — M9905 Segmental and somatic dysfunction of pelvic region: Secondary | ICD-10-CM | POA: Diagnosis not present

## 2016-09-29 DIAGNOSIS — M5136 Other intervertebral disc degeneration, lumbar region: Secondary | ICD-10-CM | POA: Diagnosis not present

## 2016-09-29 DIAGNOSIS — M9903 Segmental and somatic dysfunction of lumbar region: Secondary | ICD-10-CM | POA: Diagnosis not present

## 2016-10-06 DIAGNOSIS — M5136 Other intervertebral disc degeneration, lumbar region: Secondary | ICD-10-CM | POA: Diagnosis not present

## 2016-10-06 DIAGNOSIS — M9905 Segmental and somatic dysfunction of pelvic region: Secondary | ICD-10-CM | POA: Diagnosis not present

## 2016-10-06 DIAGNOSIS — M9903 Segmental and somatic dysfunction of lumbar region: Secondary | ICD-10-CM | POA: Diagnosis not present

## 2016-10-06 DIAGNOSIS — M955 Acquired deformity of pelvis: Secondary | ICD-10-CM | POA: Diagnosis not present

## 2016-10-08 DIAGNOSIS — N3020 Other chronic cystitis without hematuria: Secondary | ICD-10-CM | POA: Diagnosis not present

## 2016-10-08 DIAGNOSIS — R350 Frequency of micturition: Secondary | ICD-10-CM | POA: Diagnosis not present

## 2016-10-08 DIAGNOSIS — R351 Nocturia: Secondary | ICD-10-CM | POA: Diagnosis not present

## 2016-10-13 DIAGNOSIS — M9905 Segmental and somatic dysfunction of pelvic region: Secondary | ICD-10-CM | POA: Diagnosis not present

## 2016-10-13 DIAGNOSIS — M5136 Other intervertebral disc degeneration, lumbar region: Secondary | ICD-10-CM | POA: Diagnosis not present

## 2016-10-13 DIAGNOSIS — M955 Acquired deformity of pelvis: Secondary | ICD-10-CM | POA: Diagnosis not present

## 2016-10-13 DIAGNOSIS — M9903 Segmental and somatic dysfunction of lumbar region: Secondary | ICD-10-CM | POA: Diagnosis not present

## 2016-10-19 DIAGNOSIS — T7840XA Allergy, unspecified, initial encounter: Secondary | ICD-10-CM | POA: Diagnosis not present

## 2016-10-19 DIAGNOSIS — N390 Urinary tract infection, site not specified: Secondary | ICD-10-CM | POA: Diagnosis not present

## 2016-10-20 DIAGNOSIS — M955 Acquired deformity of pelvis: Secondary | ICD-10-CM | POA: Diagnosis not present

## 2016-10-20 DIAGNOSIS — M5136 Other intervertebral disc degeneration, lumbar region: Secondary | ICD-10-CM | POA: Diagnosis not present

## 2016-10-20 DIAGNOSIS — M9905 Segmental and somatic dysfunction of pelvic region: Secondary | ICD-10-CM | POA: Diagnosis not present

## 2016-10-20 DIAGNOSIS — M9903 Segmental and somatic dysfunction of lumbar region: Secondary | ICD-10-CM | POA: Diagnosis not present

## 2016-10-27 DIAGNOSIS — M9905 Segmental and somatic dysfunction of pelvic region: Secondary | ICD-10-CM | POA: Diagnosis not present

## 2016-10-27 DIAGNOSIS — M5136 Other intervertebral disc degeneration, lumbar region: Secondary | ICD-10-CM | POA: Diagnosis not present

## 2016-10-27 DIAGNOSIS — M9903 Segmental and somatic dysfunction of lumbar region: Secondary | ICD-10-CM | POA: Diagnosis not present

## 2016-10-27 DIAGNOSIS — M955 Acquired deformity of pelvis: Secondary | ICD-10-CM | POA: Diagnosis not present

## 2016-11-02 DIAGNOSIS — M955 Acquired deformity of pelvis: Secondary | ICD-10-CM | POA: Diagnosis not present

## 2016-11-02 DIAGNOSIS — M9903 Segmental and somatic dysfunction of lumbar region: Secondary | ICD-10-CM | POA: Diagnosis not present

## 2016-11-02 DIAGNOSIS — M5136 Other intervertebral disc degeneration, lumbar region: Secondary | ICD-10-CM | POA: Diagnosis not present

## 2016-11-02 DIAGNOSIS — M9905 Segmental and somatic dysfunction of pelvic region: Secondary | ICD-10-CM | POA: Diagnosis not present

## 2016-11-03 DIAGNOSIS — N3020 Other chronic cystitis without hematuria: Secondary | ICD-10-CM | POA: Diagnosis not present

## 2016-11-09 DIAGNOSIS — M9903 Segmental and somatic dysfunction of lumbar region: Secondary | ICD-10-CM | POA: Diagnosis not present

## 2016-11-09 DIAGNOSIS — M9905 Segmental and somatic dysfunction of pelvic region: Secondary | ICD-10-CM | POA: Diagnosis not present

## 2016-11-09 DIAGNOSIS — M955 Acquired deformity of pelvis: Secondary | ICD-10-CM | POA: Diagnosis not present

## 2016-11-09 DIAGNOSIS — M5136 Other intervertebral disc degeneration, lumbar region: Secondary | ICD-10-CM | POA: Diagnosis not present

## 2016-11-10 DIAGNOSIS — E7800 Pure hypercholesterolemia, unspecified: Secondary | ICD-10-CM | POA: Diagnosis not present

## 2016-11-10 DIAGNOSIS — K219 Gastro-esophageal reflux disease without esophagitis: Secondary | ICD-10-CM | POA: Diagnosis not present

## 2016-11-10 DIAGNOSIS — N390 Urinary tract infection, site not specified: Secondary | ICD-10-CM | POA: Diagnosis not present

## 2016-11-10 DIAGNOSIS — N183 Chronic kidney disease, stage 3 (moderate): Secondary | ICD-10-CM | POA: Diagnosis not present

## 2016-11-16 DIAGNOSIS — M9903 Segmental and somatic dysfunction of lumbar region: Secondary | ICD-10-CM | POA: Diagnosis not present

## 2016-11-16 DIAGNOSIS — M5136 Other intervertebral disc degeneration, lumbar region: Secondary | ICD-10-CM | POA: Diagnosis not present

## 2016-11-16 DIAGNOSIS — M955 Acquired deformity of pelvis: Secondary | ICD-10-CM | POA: Diagnosis not present

## 2016-11-16 DIAGNOSIS — M9905 Segmental and somatic dysfunction of pelvic region: Secondary | ICD-10-CM | POA: Diagnosis not present

## 2016-11-23 DIAGNOSIS — M5136 Other intervertebral disc degeneration, lumbar region: Secondary | ICD-10-CM | POA: Diagnosis not present

## 2016-11-23 DIAGNOSIS — M955 Acquired deformity of pelvis: Secondary | ICD-10-CM | POA: Diagnosis not present

## 2016-11-23 DIAGNOSIS — M9903 Segmental and somatic dysfunction of lumbar region: Secondary | ICD-10-CM | POA: Diagnosis not present

## 2016-11-23 DIAGNOSIS — M9905 Segmental and somatic dysfunction of pelvic region: Secondary | ICD-10-CM | POA: Diagnosis not present

## 2016-11-30 DIAGNOSIS — M5136 Other intervertebral disc degeneration, lumbar region: Secondary | ICD-10-CM | POA: Diagnosis not present

## 2016-11-30 DIAGNOSIS — M9905 Segmental and somatic dysfunction of pelvic region: Secondary | ICD-10-CM | POA: Diagnosis not present

## 2016-11-30 DIAGNOSIS — M9903 Segmental and somatic dysfunction of lumbar region: Secondary | ICD-10-CM | POA: Diagnosis not present

## 2016-11-30 DIAGNOSIS — M955 Acquired deformity of pelvis: Secondary | ICD-10-CM | POA: Diagnosis not present

## 2016-12-07 DIAGNOSIS — M5136 Other intervertebral disc degeneration, lumbar region: Secondary | ICD-10-CM | POA: Diagnosis not present

## 2016-12-07 DIAGNOSIS — M9905 Segmental and somatic dysfunction of pelvic region: Secondary | ICD-10-CM | POA: Diagnosis not present

## 2016-12-07 DIAGNOSIS — M9903 Segmental and somatic dysfunction of lumbar region: Secondary | ICD-10-CM | POA: Diagnosis not present

## 2016-12-07 DIAGNOSIS — M955 Acquired deformity of pelvis: Secondary | ICD-10-CM | POA: Diagnosis not present

## 2016-12-10 DIAGNOSIS — N3020 Other chronic cystitis without hematuria: Secondary | ICD-10-CM | POA: Diagnosis not present

## 2016-12-10 DIAGNOSIS — R350 Frequency of micturition: Secondary | ICD-10-CM | POA: Diagnosis not present

## 2016-12-14 DIAGNOSIS — M955 Acquired deformity of pelvis: Secondary | ICD-10-CM | POA: Diagnosis not present

## 2016-12-14 DIAGNOSIS — M9905 Segmental and somatic dysfunction of pelvic region: Secondary | ICD-10-CM | POA: Diagnosis not present

## 2016-12-14 DIAGNOSIS — M5136 Other intervertebral disc degeneration, lumbar region: Secondary | ICD-10-CM | POA: Diagnosis not present

## 2016-12-14 DIAGNOSIS — M9903 Segmental and somatic dysfunction of lumbar region: Secondary | ICD-10-CM | POA: Diagnosis not present

## 2016-12-27 DIAGNOSIS — M9903 Segmental and somatic dysfunction of lumbar region: Secondary | ICD-10-CM | POA: Diagnosis not present

## 2016-12-27 DIAGNOSIS — M955 Acquired deformity of pelvis: Secondary | ICD-10-CM | POA: Diagnosis not present

## 2016-12-27 DIAGNOSIS — M5136 Other intervertebral disc degeneration, lumbar region: Secondary | ICD-10-CM | POA: Diagnosis not present

## 2016-12-27 DIAGNOSIS — M9905 Segmental and somatic dysfunction of pelvic region: Secondary | ICD-10-CM | POA: Diagnosis not present

## 2017-01-03 DIAGNOSIS — M955 Acquired deformity of pelvis: Secondary | ICD-10-CM | POA: Diagnosis not present

## 2017-01-03 DIAGNOSIS — M9905 Segmental and somatic dysfunction of pelvic region: Secondary | ICD-10-CM | POA: Diagnosis not present

## 2017-01-03 DIAGNOSIS — M9903 Segmental and somatic dysfunction of lumbar region: Secondary | ICD-10-CM | POA: Diagnosis not present

## 2017-01-03 DIAGNOSIS — M5136 Other intervertebral disc degeneration, lumbar region: Secondary | ICD-10-CM | POA: Diagnosis not present

## 2017-01-10 DIAGNOSIS — L259 Unspecified contact dermatitis, unspecified cause: Secondary | ICD-10-CM | POA: Diagnosis not present

## 2017-01-10 DIAGNOSIS — L710 Perioral dermatitis: Secondary | ICD-10-CM | POA: Diagnosis not present

## 2017-01-11 DIAGNOSIS — M955 Acquired deformity of pelvis: Secondary | ICD-10-CM | POA: Diagnosis not present

## 2017-01-11 DIAGNOSIS — M9903 Segmental and somatic dysfunction of lumbar region: Secondary | ICD-10-CM | POA: Diagnosis not present

## 2017-01-11 DIAGNOSIS — M5136 Other intervertebral disc degeneration, lumbar region: Secondary | ICD-10-CM | POA: Diagnosis not present

## 2017-01-11 DIAGNOSIS — M9905 Segmental and somatic dysfunction of pelvic region: Secondary | ICD-10-CM | POA: Diagnosis not present

## 2017-01-18 DIAGNOSIS — M9905 Segmental and somatic dysfunction of pelvic region: Secondary | ICD-10-CM | POA: Diagnosis not present

## 2017-01-18 DIAGNOSIS — M5136 Other intervertebral disc degeneration, lumbar region: Secondary | ICD-10-CM | POA: Diagnosis not present

## 2017-01-18 DIAGNOSIS — M955 Acquired deformity of pelvis: Secondary | ICD-10-CM | POA: Diagnosis not present

## 2017-01-18 DIAGNOSIS — M9903 Segmental and somatic dysfunction of lumbar region: Secondary | ICD-10-CM | POA: Diagnosis not present

## 2017-01-26 DIAGNOSIS — M5136 Other intervertebral disc degeneration, lumbar region: Secondary | ICD-10-CM | POA: Diagnosis not present

## 2017-01-26 DIAGNOSIS — M9903 Segmental and somatic dysfunction of lumbar region: Secondary | ICD-10-CM | POA: Diagnosis not present

## 2017-01-26 DIAGNOSIS — M9905 Segmental and somatic dysfunction of pelvic region: Secondary | ICD-10-CM | POA: Diagnosis not present

## 2017-01-26 DIAGNOSIS — M955 Acquired deformity of pelvis: Secondary | ICD-10-CM | POA: Diagnosis not present

## 2017-02-08 DIAGNOSIS — R350 Frequency of micturition: Secondary | ICD-10-CM | POA: Diagnosis not present

## 2017-02-08 DIAGNOSIS — N3020 Other chronic cystitis without hematuria: Secondary | ICD-10-CM | POA: Diagnosis not present

## 2017-02-09 DIAGNOSIS — M9903 Segmental and somatic dysfunction of lumbar region: Secondary | ICD-10-CM | POA: Diagnosis not present

## 2017-02-09 DIAGNOSIS — M9905 Segmental and somatic dysfunction of pelvic region: Secondary | ICD-10-CM | POA: Diagnosis not present

## 2017-02-09 DIAGNOSIS — M955 Acquired deformity of pelvis: Secondary | ICD-10-CM | POA: Diagnosis not present

## 2017-02-09 DIAGNOSIS — M5136 Other intervertebral disc degeneration, lumbar region: Secondary | ICD-10-CM | POA: Diagnosis not present

## 2017-03-02 DIAGNOSIS — M9903 Segmental and somatic dysfunction of lumbar region: Secondary | ICD-10-CM | POA: Diagnosis not present

## 2017-03-02 DIAGNOSIS — M955 Acquired deformity of pelvis: Secondary | ICD-10-CM | POA: Diagnosis not present

## 2017-03-02 DIAGNOSIS — M9905 Segmental and somatic dysfunction of pelvic region: Secondary | ICD-10-CM | POA: Diagnosis not present

## 2017-03-02 DIAGNOSIS — M5136 Other intervertebral disc degeneration, lumbar region: Secondary | ICD-10-CM | POA: Diagnosis not present

## 2017-03-16 DIAGNOSIS — M9903 Segmental and somatic dysfunction of lumbar region: Secondary | ICD-10-CM | POA: Diagnosis not present

## 2017-03-16 DIAGNOSIS — M9905 Segmental and somatic dysfunction of pelvic region: Secondary | ICD-10-CM | POA: Diagnosis not present

## 2017-03-16 DIAGNOSIS — M5136 Other intervertebral disc degeneration, lumbar region: Secondary | ICD-10-CM | POA: Diagnosis not present

## 2017-03-16 DIAGNOSIS — M955 Acquired deformity of pelvis: Secondary | ICD-10-CM | POA: Diagnosis not present

## 2017-04-05 DIAGNOSIS — M9905 Segmental and somatic dysfunction of pelvic region: Secondary | ICD-10-CM | POA: Diagnosis not present

## 2017-04-05 DIAGNOSIS — M5136 Other intervertebral disc degeneration, lumbar region: Secondary | ICD-10-CM | POA: Diagnosis not present

## 2017-04-05 DIAGNOSIS — M955 Acquired deformity of pelvis: Secondary | ICD-10-CM | POA: Diagnosis not present

## 2017-04-05 DIAGNOSIS — M9903 Segmental and somatic dysfunction of lumbar region: Secondary | ICD-10-CM | POA: Diagnosis not present

## 2017-04-06 DIAGNOSIS — H2513 Age-related nuclear cataract, bilateral: Secondary | ICD-10-CM | POA: Diagnosis not present

## 2017-04-19 DIAGNOSIS — M955 Acquired deformity of pelvis: Secondary | ICD-10-CM | POA: Diagnosis not present

## 2017-04-19 DIAGNOSIS — M9905 Segmental and somatic dysfunction of pelvic region: Secondary | ICD-10-CM | POA: Diagnosis not present

## 2017-04-19 DIAGNOSIS — M5136 Other intervertebral disc degeneration, lumbar region: Secondary | ICD-10-CM | POA: Diagnosis not present

## 2017-04-19 DIAGNOSIS — M9903 Segmental and somatic dysfunction of lumbar region: Secondary | ICD-10-CM | POA: Diagnosis not present

## 2017-05-03 DIAGNOSIS — M5136 Other intervertebral disc degeneration, lumbar region: Secondary | ICD-10-CM | POA: Diagnosis not present

## 2017-05-03 DIAGNOSIS — M955 Acquired deformity of pelvis: Secondary | ICD-10-CM | POA: Diagnosis not present

## 2017-05-03 DIAGNOSIS — M9905 Segmental and somatic dysfunction of pelvic region: Secondary | ICD-10-CM | POA: Diagnosis not present

## 2017-05-03 DIAGNOSIS — M9903 Segmental and somatic dysfunction of lumbar region: Secondary | ICD-10-CM | POA: Diagnosis not present

## 2017-05-12 DIAGNOSIS — E7800 Pure hypercholesterolemia, unspecified: Secondary | ICD-10-CM | POA: Diagnosis not present

## 2017-05-12 DIAGNOSIS — K219 Gastro-esophageal reflux disease without esophagitis: Secondary | ICD-10-CM | POA: Diagnosis not present

## 2017-05-12 DIAGNOSIS — J309 Allergic rhinitis, unspecified: Secondary | ICD-10-CM | POA: Diagnosis not present

## 2017-05-12 DIAGNOSIS — N390 Urinary tract infection, site not specified: Secondary | ICD-10-CM | POA: Diagnosis not present

## 2017-05-12 DIAGNOSIS — N183 Chronic kidney disease, stage 3 (moderate): Secondary | ICD-10-CM | POA: Diagnosis not present

## 2017-05-17 DIAGNOSIS — M9903 Segmental and somatic dysfunction of lumbar region: Secondary | ICD-10-CM | POA: Diagnosis not present

## 2017-05-17 DIAGNOSIS — M9905 Segmental and somatic dysfunction of pelvic region: Secondary | ICD-10-CM | POA: Diagnosis not present

## 2017-05-17 DIAGNOSIS — M5136 Other intervertebral disc degeneration, lumbar region: Secondary | ICD-10-CM | POA: Diagnosis not present

## 2017-05-17 DIAGNOSIS — M955 Acquired deformity of pelvis: Secondary | ICD-10-CM | POA: Diagnosis not present

## 2017-05-31 DIAGNOSIS — M955 Acquired deformity of pelvis: Secondary | ICD-10-CM | POA: Diagnosis not present

## 2017-05-31 DIAGNOSIS — M9905 Segmental and somatic dysfunction of pelvic region: Secondary | ICD-10-CM | POA: Diagnosis not present

## 2017-05-31 DIAGNOSIS — M9903 Segmental and somatic dysfunction of lumbar region: Secondary | ICD-10-CM | POA: Diagnosis not present

## 2017-05-31 DIAGNOSIS — M5136 Other intervertebral disc degeneration, lumbar region: Secondary | ICD-10-CM | POA: Diagnosis not present

## 2017-06-21 DIAGNOSIS — M9903 Segmental and somatic dysfunction of lumbar region: Secondary | ICD-10-CM | POA: Diagnosis not present

## 2017-06-21 DIAGNOSIS — M955 Acquired deformity of pelvis: Secondary | ICD-10-CM | POA: Diagnosis not present

## 2017-06-21 DIAGNOSIS — M9905 Segmental and somatic dysfunction of pelvic region: Secondary | ICD-10-CM | POA: Diagnosis not present

## 2017-06-21 DIAGNOSIS — M5136 Other intervertebral disc degeneration, lumbar region: Secondary | ICD-10-CM | POA: Diagnosis not present

## 2017-07-12 DIAGNOSIS — M955 Acquired deformity of pelvis: Secondary | ICD-10-CM | POA: Diagnosis not present

## 2017-07-12 DIAGNOSIS — M9903 Segmental and somatic dysfunction of lumbar region: Secondary | ICD-10-CM | POA: Diagnosis not present

## 2017-07-12 DIAGNOSIS — M9905 Segmental and somatic dysfunction of pelvic region: Secondary | ICD-10-CM | POA: Diagnosis not present

## 2017-07-12 DIAGNOSIS — M5136 Other intervertebral disc degeneration, lumbar region: Secondary | ICD-10-CM | POA: Diagnosis not present

## 2017-08-03 DIAGNOSIS — M9905 Segmental and somatic dysfunction of pelvic region: Secondary | ICD-10-CM | POA: Diagnosis not present

## 2017-08-03 DIAGNOSIS — M955 Acquired deformity of pelvis: Secondary | ICD-10-CM | POA: Diagnosis not present

## 2017-08-03 DIAGNOSIS — M9903 Segmental and somatic dysfunction of lumbar region: Secondary | ICD-10-CM | POA: Diagnosis not present

## 2017-08-03 DIAGNOSIS — M5136 Other intervertebral disc degeneration, lumbar region: Secondary | ICD-10-CM | POA: Diagnosis not present

## 2017-08-17 DIAGNOSIS — M5136 Other intervertebral disc degeneration, lumbar region: Secondary | ICD-10-CM | POA: Diagnosis not present

## 2017-08-17 DIAGNOSIS — M955 Acquired deformity of pelvis: Secondary | ICD-10-CM | POA: Diagnosis not present

## 2017-08-17 DIAGNOSIS — M9905 Segmental and somatic dysfunction of pelvic region: Secondary | ICD-10-CM | POA: Diagnosis not present

## 2017-08-17 DIAGNOSIS — M9903 Segmental and somatic dysfunction of lumbar region: Secondary | ICD-10-CM | POA: Diagnosis not present

## 2017-09-07 DIAGNOSIS — M955 Acquired deformity of pelvis: Secondary | ICD-10-CM | POA: Diagnosis not present

## 2017-09-07 DIAGNOSIS — M9903 Segmental and somatic dysfunction of lumbar region: Secondary | ICD-10-CM | POA: Diagnosis not present

## 2017-09-07 DIAGNOSIS — M9905 Segmental and somatic dysfunction of pelvic region: Secondary | ICD-10-CM | POA: Diagnosis not present

## 2017-09-07 DIAGNOSIS — M5136 Other intervertebral disc degeneration, lumbar region: Secondary | ICD-10-CM | POA: Diagnosis not present

## 2017-09-28 DIAGNOSIS — M955 Acquired deformity of pelvis: Secondary | ICD-10-CM | POA: Diagnosis not present

## 2017-09-28 DIAGNOSIS — M9903 Segmental and somatic dysfunction of lumbar region: Secondary | ICD-10-CM | POA: Diagnosis not present

## 2017-09-28 DIAGNOSIS — M5136 Other intervertebral disc degeneration, lumbar region: Secondary | ICD-10-CM | POA: Diagnosis not present

## 2017-09-28 DIAGNOSIS — M9905 Segmental and somatic dysfunction of pelvic region: Secondary | ICD-10-CM | POA: Diagnosis not present

## 2017-10-19 DIAGNOSIS — M9903 Segmental and somatic dysfunction of lumbar region: Secondary | ICD-10-CM | POA: Diagnosis not present

## 2017-10-19 DIAGNOSIS — M5136 Other intervertebral disc degeneration, lumbar region: Secondary | ICD-10-CM | POA: Diagnosis not present

## 2017-10-19 DIAGNOSIS — M955 Acquired deformity of pelvis: Secondary | ICD-10-CM | POA: Diagnosis not present

## 2017-10-19 DIAGNOSIS — M9905 Segmental and somatic dysfunction of pelvic region: Secondary | ICD-10-CM | POA: Diagnosis not present

## 2017-11-09 DIAGNOSIS — M9903 Segmental and somatic dysfunction of lumbar region: Secondary | ICD-10-CM | POA: Diagnosis not present

## 2017-11-09 DIAGNOSIS — M5136 Other intervertebral disc degeneration, lumbar region: Secondary | ICD-10-CM | POA: Diagnosis not present

## 2017-11-09 DIAGNOSIS — M955 Acquired deformity of pelvis: Secondary | ICD-10-CM | POA: Diagnosis not present

## 2017-11-09 DIAGNOSIS — M9905 Segmental and somatic dysfunction of pelvic region: Secondary | ICD-10-CM | POA: Diagnosis not present

## 2017-12-05 DIAGNOSIS — M9905 Segmental and somatic dysfunction of pelvic region: Secondary | ICD-10-CM | POA: Diagnosis not present

## 2017-12-05 DIAGNOSIS — M9903 Segmental and somatic dysfunction of lumbar region: Secondary | ICD-10-CM | POA: Diagnosis not present

## 2017-12-05 DIAGNOSIS — M5136 Other intervertebral disc degeneration, lumbar region: Secondary | ICD-10-CM | POA: Diagnosis not present

## 2017-12-05 DIAGNOSIS — M955 Acquired deformity of pelvis: Secondary | ICD-10-CM | POA: Diagnosis not present

## 2018-01-02 DIAGNOSIS — M955 Acquired deformity of pelvis: Secondary | ICD-10-CM | POA: Diagnosis not present

## 2018-01-02 DIAGNOSIS — M9903 Segmental and somatic dysfunction of lumbar region: Secondary | ICD-10-CM | POA: Diagnosis not present

## 2018-01-02 DIAGNOSIS — M9905 Segmental and somatic dysfunction of pelvic region: Secondary | ICD-10-CM | POA: Diagnosis not present

## 2018-01-02 DIAGNOSIS — M5136 Other intervertebral disc degeneration, lumbar region: Secondary | ICD-10-CM | POA: Diagnosis not present

## 2018-01-10 DIAGNOSIS — R490 Dysphonia: Secondary | ICD-10-CM | POA: Diagnosis not present

## 2018-01-10 DIAGNOSIS — K219 Gastro-esophageal reflux disease without esophagitis: Secondary | ICD-10-CM | POA: Diagnosis not present

## 2018-01-10 DIAGNOSIS — N390 Urinary tract infection, site not specified: Secondary | ICD-10-CM | POA: Diagnosis not present

## 2018-01-10 DIAGNOSIS — E7800 Pure hypercholesterolemia, unspecified: Secondary | ICD-10-CM | POA: Diagnosis not present

## 2018-01-10 DIAGNOSIS — L308 Other specified dermatitis: Secondary | ICD-10-CM | POA: Diagnosis not present

## 2018-01-10 DIAGNOSIS — N183 Chronic kidney disease, stage 3 (moderate): Secondary | ICD-10-CM | POA: Diagnosis not present

## 2018-01-27 ENCOUNTER — Encounter (HOSPITAL_COMMUNITY): Payer: Self-pay | Admitting: Emergency Medicine

## 2018-01-27 ENCOUNTER — Emergency Department (HOSPITAL_COMMUNITY): Payer: Medicare HMO

## 2018-01-27 ENCOUNTER — Emergency Department (HOSPITAL_COMMUNITY)
Admission: EM | Admit: 2018-01-27 | Discharge: 2018-01-27 | Disposition: A | Discharge status: Home / Self Care | Payer: Medicare HMO | Attending: Emergency Medicine | Admitting: Emergency Medicine

## 2018-01-27 ENCOUNTER — Other Ambulatory Visit: Payer: Self-pay

## 2018-01-27 DIAGNOSIS — M79601 Pain in right arm: Secondary | ICD-10-CM

## 2018-01-27 DIAGNOSIS — S4991XA Unspecified injury of right shoulder and upper arm, initial encounter: Secondary | ICD-10-CM | POA: Diagnosis not present

## 2018-01-27 DIAGNOSIS — Z79899 Other long term (current) drug therapy: Secondary | ICD-10-CM | POA: Diagnosis not present

## 2018-01-27 DIAGNOSIS — W19XXXA Unspecified fall, initial encounter: Secondary | ICD-10-CM

## 2018-01-27 DIAGNOSIS — M25511 Pain in right shoulder: Secondary | ICD-10-CM | POA: Diagnosis not present

## 2018-01-27 DIAGNOSIS — M79621 Pain in right upper arm: Secondary | ICD-10-CM | POA: Diagnosis not present

## 2018-01-27 MED ORDER — ACETAMINOPHEN 500 MG PO TABS: 1000.0000 mg | ORAL_TABLET | Freq: Three times a day (TID) | ORAL | 0 refills | Status: AC

## 2018-01-27 MED ORDER — ACETAMINOPHEN 500 MG PO TABS
1000.0000 mg | ORAL_TABLET | Freq: Once | ORAL | Status: AC
  Administered 2018-01-27: 1000 mg via ORAL
  Filled 2018-01-27: qty 2

## 2018-01-27 NOTE — ED Provider Notes (Signed)
[facility] DEPT Provider Note  CSN: [id number] Arrival date & time: 01/27/18 1557  Chief Complaint(s) Arm Pain  HPI Courtney Williamson is a 84 y.o. female who presents with right arm pain after mechanical fall.  Patient reports that she tripped on a carpet in the garage causing her to fall onto her right arm.  This occurred 7 hours prior to arrival.  She denies any significant head trauma or loss of consciousness.  She denies any neck pain or back pain.  No chest pain or shortness of breath.  Pain in the right shoulder is described as aching and throbbing.  Exacerbated with movement and palpation.  Alleviated with immobility.  Patient has not taken any medication for the pain.  Denies any other physical complaints at this time.  HPI  Past Medical History Past Medical History:  Diagnosis Date  . Allergy   . Arthritis   . Basal cell carcinoma    on nose  . Chicken pox   . Depression   . GERD (gastroesophageal reflux disease)   . Hyperlipidemia   . Urinary tract infection, chronic    on antibiotic now   There are no active problems to display for this patient.  Home Medication(s) Prior to Admission medications   Medication Sig Start Date End Date Taking? Authorizing Provider  Calcium Citrate-Vitamin D (CITRACAL + D PO) Take 500 Units by mouth.   Yes [provider]  cephALEXin (KEFLEX) 250 MG capsule Take 250 mg by mouth daily.   Yes [provider]  Coenzyme Q10 (CO Q-10) 200 MG CAPS Take 1 capsule by mouth daily.    Yes [provider]  Cranberry 1000 MG CAPS Take 1,000 mg by mouth daily.    Yes [provider]  esomeprazole (NEXIUM) 20 MG capsule Take 20 mg by mouth daily at 12 noon.   Yes [provider]  Garlic 4627 MG CAPS Take 1,000 mg by mouth daily.    Yes [provider]  Glucosamine 500 MG CAPS Take 1,000 mg by mouth daily.    Yes [provider]  L-Lysine 500 MG CAPS Take 1  capsule by mouth daily.    Yes [provider]  MELATONIN PO Take 2 mg by mouth at bedtime.   Yes [provider]  Probiotic Product (ALIGN PO) Take 1 capsule by mouth daily.    Yes [provider]  psyllium (METAMUCIL) 58.6 % packet Take 1 packet by mouth every other day.   Yes [provider]  Thiamine HCl (VITAMIN B-1 PO) Take 1 tablet by mouth daily.   Yes [provider]  triamcinolone cream (KENALOG) 0.1 % APPLY TO AFFECTED AREA TWICE A DAY 01/10/18  Yes [provider]  vitamin B-12 (CYANOCOBALAMIN) 1000 MCG tablet Take 1,000 mcg by mouth daily.   Yes [provider]  acetaminophen (TYLENOL) 500 MG tablet Take 2 tablets (1,000 mg total) by mouth every 8 (eight) hours for 5 days. Do not take more than 4000 mg of acetaminophen (Tylenol) in a 24-hour period. Please note that other medicines that you may be prescribed may have Tylenol as well. 01/27/18 02/01/18  Cardama, Grayce Sessions, MD  Past Surgical History Past Surgical History:  Procedure Laterality Date  . ABDOMINAL HYSTERECTOMY    . APPENDECTOMY    . bladder tuck    . CHOLECYSTECTOMY    . TONSILLECTOMY     Family History Family History  Problem Relation Age of Onset  . Arthritis Mother   . Hypertension Mother   . Arthritis Father     Social History Social History   Tobacco Use  . Smoking status: Never Smoker  . Smokeless tobacco: Never Used  Substance Use Topics  . Alcohol use: No    Alcohol/week: 0.0 oz  . Drug use: No   Allergies Latex; Nitrofurantoin; Penicillins; Sulfa antibiotics; and Tetracycline  Review of Systems Review of Systems All other systems are reviewed and are negative for acute change except as noted in the HPI  Physical Exam Vital Signs  I have reviewed the triage vital signs BP (!) 173/99 (BP Location:  Left Arm)   Pulse 90   Temp 98 F (36.7 C) (Oral)   Resp 20   SpO2 98%   Physical Exam  Constitutional: She is oriented to person, place, and time. She appears well-developed and well-nourished. No distress.  HENT:  Head: Normocephalic and atraumatic.  Right Ear: External ear normal.  Left Ear: External ear normal.  Nose: Nose normal.  Eyes: Conjunctivae and EOM are normal. No scleral icterus.  Neck: Normal range of motion and phonation normal.  Cardiovascular: Normal rate and regular rhythm.  Pulmonary/Chest: Effort normal. No stridor. No respiratory distress.  Abdominal: She exhibits no distension.  Musculoskeletal: Normal range of motion. She exhibits no edema.  No deformity.  Clavicle nontender.  Scapular nontender.  Chest wall nontender.  Mild tenderness to the deltoid.  Tenderness to palpation to the proximal humerus.  Elbow, forearm, wrist and hand nontender.  Neurovascular intact distally.  Patient is unwilling to range the shoulder due to pain.  Neurological: She is alert and oriented to person, place, and time.  Skin: She is not diaphoretic.  Psychiatric: She has a normal mood and affect. Her behavior is normal.  Vitals reviewed.   ED Results and Treatments Labs (all labs ordered are listed, but only abnormal results are displayed) Labs Reviewed - No data to display                                                                                                                       EKG  EKG Interpretation  Date/Time:    Ventricular Rate:    PR Interval:    QRS Duration:   QT Interval:    QTC Calculation:   R Axis:     Text Interpretation:        Radiology Dg Humerus Right  Result Date: 01/27/2018 CLINICAL DATA:  Fall.  Upper shoulder pain. EXAM: RIGHT HUMERUS - 2+ VIEW COMPARISON:  None. FINDINGS: There is no evidence of fracture or other focal bone lesions. Soft tissues are unremarkable. IMPRESSION: Negative. Electronically Signed   By: Thayer Jew  Janeece Fitting  M.D.   On: 01/27/2018 16:57   Pertinent labs & imaging results that were available during my care of the patient were reviewed by me and considered in my medical decision making (see chart for details).  Medications Ordered in ED Medications  acetaminophen (TYLENOL) tablet 1,000 mg (1,000 mg Oral Given 01/27/18 2103)                                                                                                                                    Procedures Procedures  (including critical care time)  Medical Decision Making / ED Course I have reviewed the nursing notes for this encounter and the patient's prior records (if available in EHR or on provided paperwork).    Mechanical fall resulting in right shoulder pain.  Plain film without evidence of dislocation or fracture.  Possible contusion versus rotator cuff injury.  Provided with oral pain medicine and shoulder immobilizer.  Recommended follow-up with PCP/orthopedic surgery in 1-2 weeks if pain persists.  The patient appears reasonably screened and/or stabilized for discharge and I doubt any other medical condition or other [facility] requiring further screening, evaluation, or treatment in the ED at this time prior to discharge.  The patient is safe for discharge with strict return precautions.   Final Clinical Impression(s) / ED Diagnoses Final diagnoses:  Right arm pain  Fall, initial encounter   Disposition: Discharge  Condition: Good  I have discussed the results, Dx and Tx plan with the patient who expressed understanding and agree(s) with the plan. Discharge instructions discussed at great length. The patient was given strict return precautions who verbalized understanding of the instructions. No further questions at time of discharge.    ED Discharge Orders        Ordered    acetaminophen (TYLENOL) 500 MG tablet  Every 8 hours     01/27/18 2108       Follow Up: Shirline Frees, MD [place] [id number] [phone]  Schedule an appointment as soon as possible for a visit  In 1-2 weeks if pain persists  Orthopedic surgery  Call  In 1-2 weeks if pain persist      This chart was dictated using voice recognition software.  Despite best efforts to proofread,  errors can occur which can change the documentation meaning.   Fatima Blank, MD 01/27/18 2109

## 2018-01-27 NOTE — ED Triage Notes (Signed)
Pt complaint of right upper arm pain post fall.

## 2018-01-31 DIAGNOSIS — M25511 Pain in right shoulder: Secondary | ICD-10-CM | POA: Diagnosis not present

## 2018-01-31 DIAGNOSIS — I10 Essential (primary) hypertension: Secondary | ICD-10-CM | POA: Diagnosis not present

## 2018-01-31 DIAGNOSIS — S46911D Strain of unspecified muscle, fascia and tendon at shoulder and upper arm level, right arm, subsequent encounter: Secondary | ICD-10-CM | POA: Diagnosis not present

## 2018-02-23 DIAGNOSIS — M9905 Segmental and somatic dysfunction of pelvic region: Secondary | ICD-10-CM | POA: Diagnosis not present

## 2018-02-23 DIAGNOSIS — M9903 Segmental and somatic dysfunction of lumbar region: Secondary | ICD-10-CM | POA: Diagnosis not present

## 2018-02-23 DIAGNOSIS — M5136 Other intervertebral disc degeneration, lumbar region: Secondary | ICD-10-CM | POA: Diagnosis not present

## 2018-02-23 DIAGNOSIS — M955 Acquired deformity of pelvis: Secondary | ICD-10-CM | POA: Diagnosis not present

## 2018-02-27 DIAGNOSIS — M9905 Segmental and somatic dysfunction of pelvic region: Secondary | ICD-10-CM | POA: Diagnosis not present

## 2018-02-27 DIAGNOSIS — M9903 Segmental and somatic dysfunction of lumbar region: Secondary | ICD-10-CM | POA: Diagnosis not present

## 2018-02-27 DIAGNOSIS — M955 Acquired deformity of pelvis: Secondary | ICD-10-CM | POA: Diagnosis not present

## 2018-02-27 DIAGNOSIS — M5136 Other intervertebral disc degeneration, lumbar region: Secondary | ICD-10-CM | POA: Diagnosis not present

## 2018-03-01 DIAGNOSIS — M25511 Pain in right shoulder: Secondary | ICD-10-CM | POA: Diagnosis not present

## 2018-03-03 DIAGNOSIS — N3020 Other chronic cystitis without hematuria: Secondary | ICD-10-CM | POA: Diagnosis not present

## 2018-03-03 DIAGNOSIS — R350 Frequency of micturition: Secondary | ICD-10-CM | POA: Diagnosis not present

## 2018-03-07 DIAGNOSIS — M9903 Segmental and somatic dysfunction of lumbar region: Secondary | ICD-10-CM | POA: Diagnosis not present

## 2018-03-07 DIAGNOSIS — M5136 Other intervertebral disc degeneration, lumbar region: Secondary | ICD-10-CM | POA: Diagnosis not present

## 2018-03-07 DIAGNOSIS — M955 Acquired deformity of pelvis: Secondary | ICD-10-CM | POA: Diagnosis not present

## 2018-03-07 DIAGNOSIS — M9905 Segmental and somatic dysfunction of pelvic region: Secondary | ICD-10-CM | POA: Diagnosis not present

## 2018-03-10 DIAGNOSIS — M25511 Pain in right shoulder: Secondary | ICD-10-CM | POA: Diagnosis not present

## 2018-03-14 DIAGNOSIS — M9905 Segmental and somatic dysfunction of pelvic region: Secondary | ICD-10-CM | POA: Diagnosis not present

## 2018-03-14 DIAGNOSIS — M955 Acquired deformity of pelvis: Secondary | ICD-10-CM | POA: Diagnosis not present

## 2018-03-14 DIAGNOSIS — M9903 Segmental and somatic dysfunction of lumbar region: Secondary | ICD-10-CM | POA: Diagnosis not present

## 2018-03-14 DIAGNOSIS — M5136 Other intervertebral disc degeneration, lumbar region: Secondary | ICD-10-CM | POA: Diagnosis not present

## 2018-03-15 DIAGNOSIS — M25511 Pain in right shoulder: Secondary | ICD-10-CM | POA: Diagnosis not present

## 2018-03-17 DIAGNOSIS — G8929 Other chronic pain: Secondary | ICD-10-CM | POA: Diagnosis not present

## 2018-03-17 DIAGNOSIS — M25511 Pain in right shoulder: Secondary | ICD-10-CM | POA: Diagnosis not present

## 2018-03-17 DIAGNOSIS — L820 Inflamed seborrheic keratosis: Secondary | ICD-10-CM | POA: Diagnosis not present

## 2018-03-17 DIAGNOSIS — I10 Essential (primary) hypertension: Secondary | ICD-10-CM | POA: Diagnosis not present

## 2018-03-28 DIAGNOSIS — M9903 Segmental and somatic dysfunction of lumbar region: Secondary | ICD-10-CM | POA: Diagnosis not present

## 2018-03-28 DIAGNOSIS — M5136 Other intervertebral disc degeneration, lumbar region: Secondary | ICD-10-CM | POA: Diagnosis not present

## 2018-03-28 DIAGNOSIS — M9905 Segmental and somatic dysfunction of pelvic region: Secondary | ICD-10-CM | POA: Diagnosis not present

## 2018-03-28 DIAGNOSIS — M955 Acquired deformity of pelvis: Secondary | ICD-10-CM | POA: Diagnosis not present

## 2018-03-30 NOTE — H&P (Signed)
Courtney Williamson is an 84 y.o. female.    Chief Complaint: right shoulder pain  HPI: Pt is a 84 y.o. female complaining of right shoulder pain for multiple years. Pain had continually increased since the beginning. X-rays in the clinic show end-stage arthritic changes of the right shoulder. Pt has tried various conservative treatments which have failed to alleviate their symptoms, including injections and therapy. Various options are discussed with the patient. Risks, benefits and expectations were discussed with the patient. Patient understand the risks, benefits and expectations and wishes to proceed with surgery.   PCP:  Shirline Frees, MD  D/C Plans: Home  PMH: Past Medical History:  Diagnosis Date  . Allergy   . Arthritis   . Basal cell carcinoma    on nose  . Chicken pox   . Depression   . GERD (gastroesophageal reflux disease)   . Hyperlipidemia   . Urinary tract infection, chronic    on antibiotic now    PSH: Past Surgical History:  Procedure Laterality Date  . ABDOMINAL HYSTERECTOMY    . APPENDECTOMY    . bladder tuck    . CHOLECYSTECTOMY    . TONSILLECTOMY      Social History:  reports that she has never smoked. She has never used smokeless tobacco. She reports that she does not drink alcohol or use drugs.  Allergies:  Allergies  Allergen Reactions  . Latex     REACTION: breakout  . Nitrofurantoin     REACTION: whelps  . Penicillins     Has patient had a PCN reaction causing immediate rash, facial/tongue/throat swelling, SOB or lightheadedness with hypotension: Y Has patient had a PCN reaction causing severe rash involving mucus membranes or skin necrosis: Y Has patient had a PCN reaction that required hospitalization: N Has patient had a PCN reaction occurring within the last 10 years: N If all of the above answers are "NO", then may proceed with Cephalosporin use.   . Sulfa Antibiotics Hives  . Tetracycline     REACTION: whelps     Medications: No current facility-administered medications for this encounter.    Current Outpatient Medications  Medication Sig Dispense Refill  . Calcium Citrate-Vitamin D (CITRACAL + D PO) Take 500 Units by mouth.    . cephALEXin (KEFLEX) 250 MG capsule Take 250 mg by mouth daily.    . Coenzyme Q10 (CO Q-10) 200 MG CAPS Take 1 capsule by mouth daily.     . Cranberry 1000 MG CAPS Take 1,000 mg by mouth daily.     Marland Kitchen esomeprazole (NEXIUM) 20 MG capsule Take 20 mg by mouth daily at 12 noon.    . Garlic 4098 MG CAPS Take 1,000 mg by mouth daily.     . Glucosamine 500 MG CAPS Take 1,000 mg by mouth daily.     Marland Kitchen L-Lysine 500 MG CAPS Take 1 capsule by mouth daily.     Marland Kitchen MELATONIN PO Take 2 mg by mouth at bedtime.    . Probiotic Product (ALIGN PO) Take 1 capsule by mouth daily.     . psyllium (METAMUCIL) 58.6 % packet Take 1 packet by mouth every other day.    . Thiamine HCl (VITAMIN B-1 PO) Take 1 tablet by mouth daily.    Marland Kitchen triamcinolone cream (KENALOG) 0.1 % APPLY TO AFFECTED AREA TWICE A DAY  1  . vitamin B-12 (CYANOCOBALAMIN) 1000 MCG tablet Take 1,000 mcg by mouth daily.      No results found for  this or any previous visit (from the past 48 hour(s)). No results found.  ROS: Pain with rom of the right upper extremity  Physical Exam:  Alert and oriented 84 y.o. female in no acute distress Cranial nerves 2-12 intact Cervical spine: full rom with no tenderness, nv intact distally Chest: active breath sounds bilaterally, no wheeze rhonchi or rales Heart: regular rate and rhythm, no murmur Abd: non tender non distended with active bowel sounds Hip is stable with rom  Right shoulder with limited rom due to weakness and pain nv intact distally No rashes or edema  Assessment/Plan Assessment: right shoulder cuff arthropathy  Plan: Patient will undergo a right reverse total shoulder by Dr. Veverly Fells at [facility]. Risks benefits and expectations were discussed with the patient.  Patient understand risks, benefits and expectations and wishes to proceed.  Merla Riches PA-C, MPAS [facility] is now [employer] [address], [place], [place], [place] [address] Phone: [phone] [email or web address]  [employer]

## 2018-04-06 NOTE — Pre-Procedure Instructions (Signed)
Courtney Williamson  04/06/2018      [id number] [place], [place] [address] Phone: [phone] Fax: [phone]    Your procedure is scheduled on Apr 14, 2018.  Report to [facility] Admitting at 530 AM.  Call this number if you have problems the morning of [phone]  Continue all medications as directed by your physician except follow these medication instructions before surgery below   Remember:  Do not eat food or drink liquids after midnight.  Take these medicines the morning of surgery with A SIP OF WATER  Tylenol Amlodipine (Norvasc) Cephalexin (keflex) Esomeprazole (nexium)  7 days prior to surgery STOP taking any Aspirin (unless otherwise instructed by your surgeon), Aleve, Naproxen, Ibuprofen, Motrin, Advil, Goody's, BC's, all herbal medications, fish oil, and all vitamins   Do not wear jewelry, make-up or nail polish.  Do not wear lotions, powders, or perfumes, or deodorant.  Do not shave 48 hours prior to surgery.    Do not bring valuables to the hospital.  [facility] is not responsible for any belongings or valuables.  Contacts, dentures or bridgework may not be worn into surgery.  Leave your suitcase in the car.  After surgery it may be brought to your room.  For patients admitted to the hospital, discharge time will be determined by your treatment team.  Patients discharged the day of surgery will not be allowed to drive home.   [facility]- Preparing For Surgery  Before surgery, you can play an important role. Because skin is not sterile, your skin needs to be as free of germs as possible. You can reduce the number of germs on your skin by washing with CHG (chlorahexidine gluconate) Soap before surgery.  CHG is an antiseptic cleaner which kills germs and bonds with the skin to continue killing germs even after washing.  Please do not use if you have an allergy to CHG or  antibacterial soaps. If your skin becomes reddened/irritated stop using the CHG.  Do not shave (including legs and underarms) for at least 48 hours prior to first CHG shower. It is OK to shave your face.  Please follow these instructions carefully.   1. Shower the NIGHT BEFORE SURGERY and the MORNING OF SURGERY with CHG.   2. If you chose to wash your hair, wash your hair first as usual with your normal shampoo.  3. After you shampoo, rinse your hair and body thoroughly to remove the shampoo.  4. Use CHG as you would any other liquid soap. You can apply CHG directly to the skin and wash gently with a scrungie or a clean washcloth.   5. Apply the CHG Soap to your body ONLY FROM THE NECK DOWN.  Do not use on open wounds or open sores. Avoid contact with your eyes, ears, mouth and genitals (private parts). Wash Face and genitals (private parts)  with your normal soap.  6. Wash thoroughly, paying special attention to the area where your surgery will be performed.  7. Thoroughly rinse your body with warm water from the neck down.  8. DO NOT shower/wash with your normal soap after using and rinsing off the CHG Soap.  9. Pat yourself dry with a CLEAN TOWEL.  10. Wear CLEAN PAJAMAS to bed the night before surgery, wear comfortable clothes the morning of surgery  11. Place CLEAN SHEETS on your bed the night of your first shower and DO NOT SLEEP  WITH PETS.  Day of Surgery: Do not apply any deodorants/lotions. Please wear clean clothes to the hospital/surgery center.    Please read over the following fact sheets that you were given. Pain Booklet, Coughing and Deep Breathing, MRSA Information and Surgical Site Infection Prevention

## 2018-04-07 ENCOUNTER — Other Ambulatory Visit: Payer: Self-pay

## 2018-04-07 ENCOUNTER — Encounter (HOSPITAL_COMMUNITY)
Admission: RE | Admit: 2018-04-07 | Discharge: 2018-04-07 | Disposition: A | Discharge status: Home / Self Care | Payer: Medicare HMO | Source: Ambulatory Visit | Attending: Orthopedic Surgery | Admitting: Orthopedic Surgery

## 2018-04-07 ENCOUNTER — Encounter (HOSPITAL_COMMUNITY): Payer: Self-pay

## 2018-04-07 DIAGNOSIS — Z01810 Encounter for preprocedural cardiovascular examination: Secondary | ICD-10-CM | POA: Insufficient documentation

## 2018-04-07 DIAGNOSIS — Z01812 Encounter for preprocedural laboratory examination: Secondary | ICD-10-CM | POA: Diagnosis not present

## 2018-04-07 DIAGNOSIS — I10 Essential (primary) hypertension: Secondary | ICD-10-CM | POA: Diagnosis not present

## 2018-04-07 LAB — BASIC METABOLIC PANEL
Anion gap: 8 (ref 5–15)
BUN: 11 mg/dL (ref 6–20)
CHLORIDE: 102 mmol/L (ref 101–111)
CO2: 30 mmol/L (ref 22–32)
Calcium: 9.5 mg/dL (ref 8.9–10.3)
Creatinine, Ser: 0.88 mg/dL (ref 0.44–1.00)
GFR calc non Af Amer: 60 mL/min — ABNORMAL LOW (ref >60)
GLUCOSE: 91 mg/dL (ref 65–99)
Potassium: 3.7 mmol/L (ref 3.5–5.1)
SODIUM: 140 mmol/L (ref 135–145)

## 2018-04-07 LAB — CBC
HEMATOCRIT: 44.1 % (ref 36.0–46.0)
HEMOGLOBIN: 14.2 g/dL (ref 12.0–15.0)
MCH: 28.7 pg (ref 26.0–34.0)
MCHC: 32.2 g/dL (ref 30.0–36.0)
MCV: 89.3 fL (ref 78.0–100.0)
Platelets: 241 10*3/uL (ref 150–400)
RBC: 4.94 MIL/uL (ref 3.87–5.11)
RDW: 15.1 % (ref 11.5–15.5)
WBC: 7.3 10*3/uL (ref 4.0–10.5)

## 2018-04-07 LAB — SURGICAL PCR SCREEN
MRSA, PCR: NEGATIVE
Staphylococcus aureus: NEGATIVE

## 2018-04-07 NOTE — Progress Notes (Signed)
PCP - Dr. Kenton Kingfisher Cardiologist - patient denies  Chest x-ray - patient denies EKG - 04/07/2018 Stress Test - patient denies ECHO - patient denies Cardiac Cath - patient denies  Sleep Study - patient denies  Anesthesia review: n/a  Patient denies shortness of breath, fever, cough and chest pain at PAT appointment   Patient verbalized understanding of instructions that were given to them at the PAT appointment. Patient was also instructed that they will need to review over the PAT instructions again at home before surgery.

## 2018-04-11 DIAGNOSIS — M9903 Segmental and somatic dysfunction of lumbar region: Secondary | ICD-10-CM | POA: Diagnosis not present

## 2018-04-11 DIAGNOSIS — M955 Acquired deformity of pelvis: Secondary | ICD-10-CM | POA: Diagnosis not present

## 2018-04-11 DIAGNOSIS — M9905 Segmental and somatic dysfunction of pelvic region: Secondary | ICD-10-CM | POA: Diagnosis not present

## 2018-04-11 DIAGNOSIS — M5136 Other intervertebral disc degeneration, lumbar region: Secondary | ICD-10-CM | POA: Diagnosis not present

## 2018-04-13 MED ORDER — CLINDAMYCIN PHOSPHATE 900 MG/50ML IV SOLN
900.0000 mg | INTRAVENOUS | Status: AC
  Administered 2018-04-14: 900 mg via INTRAVENOUS
  Filled 2018-04-13: qty 50

## 2018-04-14 ENCOUNTER — Encounter (HOSPITAL_COMMUNITY)
Admission: RE | Disposition: A | Discharge status: Home / Self Care | Payer: Self-pay | Source: Ambulatory Visit | Attending: Orthopedic Surgery

## 2018-04-14 ENCOUNTER — Encounter (HOSPITAL_COMMUNITY): Payer: Self-pay

## 2018-04-14 ENCOUNTER — Inpatient Hospital Stay (HOSPITAL_COMMUNITY): Payer: Medicare HMO | Admitting: Anesthesiology

## 2018-04-14 ENCOUNTER — Inpatient Hospital Stay (HOSPITAL_COMMUNITY)
Admission: RE | Admit: 2018-04-14 | Discharge: 2018-04-15 | DRG: 483 | Disposition: A | Discharge status: Home / Self Care | Payer: Medicare HMO | Source: Ambulatory Visit | Attending: Orthopedic Surgery | Admitting: Orthopedic Surgery

## 2018-04-14 ENCOUNTER — Inpatient Hospital Stay (HOSPITAL_COMMUNITY): Payer: Medicare HMO

## 2018-04-14 ENCOUNTER — Other Ambulatory Visit: Payer: Self-pay

## 2018-04-14 DIAGNOSIS — Z888 Allergy status to other drugs, medicaments and biological substances status: Secondary | ICD-10-CM

## 2018-04-14 DIAGNOSIS — Z79899 Other long term (current) drug therapy: Secondary | ICD-10-CM

## 2018-04-14 DIAGNOSIS — M19011 Primary osteoarthritis, right shoulder: Secondary | ICD-10-CM | POA: Diagnosis not present

## 2018-04-14 DIAGNOSIS — M75101 Unspecified rotator cuff tear or rupture of right shoulder, not specified as traumatic: Secondary | ICD-10-CM | POA: Diagnosis not present

## 2018-04-14 DIAGNOSIS — Z885 Allergy status to narcotic agent status: Secondary | ICD-10-CM | POA: Diagnosis not present

## 2018-04-14 DIAGNOSIS — E785 Hyperlipidemia, unspecified: Secondary | ICD-10-CM | POA: Diagnosis present

## 2018-04-14 DIAGNOSIS — G8918 Other acute postprocedural pain: Secondary | ICD-10-CM | POA: Diagnosis not present

## 2018-04-14 DIAGNOSIS — Z91040 Latex allergy status: Secondary | ICD-10-CM

## 2018-04-14 DIAGNOSIS — Z96611 Presence of right artificial shoulder joint: Secondary | ICD-10-CM

## 2018-04-14 DIAGNOSIS — Z471 Aftercare following joint replacement surgery: Secondary | ICD-10-CM | POA: Diagnosis not present

## 2018-04-14 DIAGNOSIS — K219 Gastro-esophageal reflux disease without esophagitis: Secondary | ICD-10-CM | POA: Diagnosis not present

## 2018-04-14 DIAGNOSIS — Z85828 Personal history of other malignant neoplasm of skin: Secondary | ICD-10-CM

## 2018-04-14 DIAGNOSIS — Z882 Allergy status to sulfonamides status: Secondary | ICD-10-CM | POA: Diagnosis not present

## 2018-04-14 DIAGNOSIS — Z881 Allergy status to other antibiotic agents status: Secondary | ICD-10-CM | POA: Diagnosis not present

## 2018-04-14 DIAGNOSIS — M75121 Complete rotator cuff tear or rupture of right shoulder, not specified as traumatic: Secondary | ICD-10-CM | POA: Diagnosis not present

## 2018-04-14 DIAGNOSIS — Z880 Allergy status to penicillin: Secondary | ICD-10-CM

## 2018-04-14 HISTORY — PX: REVERSE SHOULDER ARTHROPLASTY: SHX5054

## 2018-04-14 SURGERY — ARTHROPLASTY, SHOULDER, TOTAL, REVERSE
Anesthesia: General | Site: Shoulder | Laterality: Right

## 2018-04-14 MED ORDER — POLYETHYL GLYCOL-PROPYL GLYCOL 0.4-0.3 % OP GEL: Freq: Every day | OPHTHALMIC | Status: DC | PRN

## 2018-04-14 MED ORDER — DOCUSATE SODIUM 100 MG PO CAPS
100.0000 mg | ORAL_CAPSULE | Freq: Two times a day (BID) | ORAL | Status: DC
  Administered 2018-04-14: 100 mg via ORAL
  Filled 2018-04-14: qty 1

## 2018-04-14 MED ORDER — ACETAMINOPHEN 325 MG PO TABS
325.0000 mg | ORAL_TABLET | Freq: Four times a day (QID) | ORAL | Status: DC | PRN
  Administered 2018-04-14 – 2018-04-15 (×2): 650 mg via ORAL
  Filled 2018-04-14 (×2): qty 2

## 2018-04-14 MED ORDER — ONDANSETRON HCL 4 MG PO TABS: 4.0000 mg | ORAL_TABLET | Freq: Four times a day (QID) | ORAL | Status: DC | PRN

## 2018-04-14 MED ORDER — CHLORHEXIDINE GLUCONATE 4 % EX LIQD: 60.0000 mL | Freq: Once | CUTANEOUS | Status: DC

## 2018-04-14 MED ORDER — MIDAZOLAM HCL 5 MG/5ML IJ SOLN
INTRAMUSCULAR | Status: DC | PRN
  Administered 2018-04-14: 1 mg via INTRAVENOUS

## 2018-04-14 MED ORDER — CO Q-10 200 MG PO CAPS: 200.0000 mg | ORAL_CAPSULE | Freq: Every day | ORAL | Status: DC

## 2018-04-14 MED ORDER — MIDAZOLAM HCL 2 MG/2ML IJ SOLN
INTRAMUSCULAR | Status: AC
  Filled 2018-04-14: qty 2

## 2018-04-14 MED ORDER — TRAMADOL HCL 50 MG PO TABS: 50.0000 mg | ORAL_TABLET | Freq: Four times a day (QID) | ORAL | 0 refills | Status: AC | PRN

## 2018-04-14 MED ORDER — ONDANSETRON HCL 4 MG/2ML IJ SOLN
INTRAMUSCULAR | Status: DC | PRN
  Administered 2018-04-14: 4 mg via INTRAVENOUS

## 2018-04-14 MED ORDER — MENTHOL 3 MG MT LOZG: 1.0000 | LOZENGE | OROMUCOSAL | Status: DC | PRN

## 2018-04-14 MED ORDER — VITAMIN B-1 50 MG PO TABS
250.0000 mg | ORAL_TABLET | Freq: Every day | ORAL | Status: DC
  Filled 2018-04-14: qty 1

## 2018-04-14 MED ORDER — FENTANYL CITRATE (PF) 100 MCG/2ML IJ SOLN
INTRAMUSCULAR | Status: DC | PRN
  Administered 2018-04-14 (×2): 25 ug via INTRAVENOUS
  Administered 2018-04-14: 50 ug via INTRAVENOUS

## 2018-04-14 MED ORDER — ACETAMINOPHEN 500 MG PO TABS: 500.0000 mg | ORAL_TABLET | Freq: Every day | ORAL | Status: DC | PRN

## 2018-04-14 MED ORDER — LIDOCAINE 2% (20 MG/ML) 5 ML SYRINGE
INTRAMUSCULAR | Status: AC
  Filled 2018-04-14: qty 5

## 2018-04-14 MED ORDER — PHENYLEPHRINE HCL 10 MG/ML IJ SOLN
INTRAVENOUS | Status: DC | PRN
  Administered 2018-04-14: 50 ug/min via INTRAVENOUS

## 2018-04-14 MED ORDER — VITAMIN B-12 1000 MCG PO TABS
1000.0000 ug | ORAL_TABLET | Freq: Every day | ORAL | Status: DC
  Filled 2018-04-14: qty 1

## 2018-04-14 MED ORDER — SODIUM CHLORIDE 0.9 % IV SOLN: INTRAVENOUS | Status: DC

## 2018-04-14 MED ORDER — SUCCINYLCHOLINE CHLORIDE 200 MG/10ML IV SOSY
PREFILLED_SYRINGE | INTRAVENOUS | Status: AC
  Filled 2018-04-14: qty 10

## 2018-04-14 MED ORDER — CEPHALEXIN 250 MG PO CAPS
250.0000 mg | ORAL_CAPSULE | Freq: Every day | ORAL | Status: DC
  Filled 2018-04-14: qty 1

## 2018-04-14 MED ORDER — AMLODIPINE BESYLATE 5 MG PO TABS: 5.0000 mg | ORAL_TABLET | Freq: Every day | ORAL | Status: DC

## 2018-04-14 MED ORDER — BUPIVACAINE-EPINEPHRINE 0.25% -1:200000 IJ SOLN
INTRAMUSCULAR | Status: DC | PRN
  Administered 2018-04-14: 9 mL

## 2018-04-14 MED ORDER — L-LYSINE 500 MG PO CAPS: 500.0000 mg | ORAL_CAPSULE | Freq: Every day | ORAL | Status: DC

## 2018-04-14 MED ORDER — METOCLOPRAMIDE HCL 5 MG/ML IJ SOLN: 5.0000 mg | Freq: Three times a day (TID) | INTRAMUSCULAR | Status: DC | PRN

## 2018-04-14 MED ORDER — CLINDAMYCIN PHOSPHATE 600 MG/50ML IV SOLN
600.0000 mg | Freq: Four times a day (QID) | INTRAVENOUS | Status: AC
  Administered 2018-04-14 – 2018-04-15 (×3): 600 mg via INTRAVENOUS
  Filled 2018-04-14 (×3): qty 50

## 2018-04-14 MED ORDER — METOCLOPRAMIDE HCL 5 MG PO TABS: 5.0000 mg | ORAL_TABLET | Freq: Three times a day (TID) | ORAL | Status: DC | PRN

## 2018-04-14 MED ORDER — CRANBERRY 250 MG PO TABS: 4200.0000 mg | ORAL_TABLET | Freq: Every day | ORAL | Status: DC

## 2018-04-14 MED ORDER — ROCURONIUM BROMIDE 100 MG/10ML IV SOLN
INTRAVENOUS | Status: DC | PRN
  Administered 2018-04-14: 50 mg via INTRAVENOUS

## 2018-04-14 MED ORDER — PROPOFOL 10 MG/ML IV BOLUS
INTRAVENOUS | Status: DC | PRN
  Administered 2018-04-14: 140 mg via INTRAVENOUS

## 2018-04-14 MED ORDER — PHENOL 1.4 % MT LIQD: 1.0000 | OROMUCOSAL | Status: DC | PRN

## 2018-04-14 MED ORDER — PROPOFOL 10 MG/ML IV BOLUS
INTRAVENOUS | Status: AC
  Filled 2018-04-14: qty 20

## 2018-04-14 MED ORDER — FENTANYL CITRATE (PF) 250 MCG/5ML IJ SOLN
INTRAMUSCULAR | Status: AC
  Filled 2018-04-14: qty 5

## 2018-04-14 MED ORDER — TRIAMCINOLONE ACETONIDE 0.1 % EX CREA
TOPICAL_CREAM | Freq: Two times a day (BID) | CUTANEOUS | Status: DC
  Administered 2018-04-14: 21:00:00 via TOPICAL
  Filled 2018-04-14: qty 15

## 2018-04-14 MED ORDER — ROCURONIUM BROMIDE 50 MG/5ML IV SOLN
INTRAVENOUS | Status: AC
  Filled 2018-04-14: qty 1

## 2018-04-14 MED ORDER — GARLIC 1000 MG PO CAPS: 1000.0000 mg | ORAL_CAPSULE | Freq: Every day | ORAL | Status: DC

## 2018-04-14 MED ORDER — PSYLLIUM 58.6 % PO PACK: 1.0000 | PACK | ORAL | Status: DC

## 2018-04-14 MED ORDER — LACTATED RINGERS IV SOLN
INTRAVENOUS | Status: DC | PRN
  Administered 2018-04-14: 07:00:00 via INTRAVENOUS

## 2018-04-14 MED ORDER — GLUCOSAMINE HCL 1500 MG PO TABS: 1500.0000 mg | ORAL_TABLET | Freq: Every day | ORAL | Status: DC

## 2018-04-14 MED ORDER — SUGAMMADEX SODIUM 200 MG/2ML IV SOLN
INTRAVENOUS | Status: DC | PRN
  Administered 2018-04-14: 200 mg via INTRAVENOUS

## 2018-04-14 MED ORDER — PROBIOTIC PO CAPS: ORAL_CAPSULE | Freq: Every day | ORAL | Status: DC

## 2018-04-14 MED ORDER — ONDANSETRON HCL 4 MG/2ML IJ SOLN: 4.0000 mg | Freq: Four times a day (QID) | INTRAMUSCULAR | Status: DC | PRN

## 2018-04-14 MED ORDER — PANTOPRAZOLE SODIUM 40 MG PO TBEC: 40.0000 mg | DELAYED_RELEASE_TABLET | Freq: Every day | ORAL | Status: DC

## 2018-04-14 MED ORDER — SODIUM CHLORIDE 0.9 % IR SOLN
Status: DC | PRN
  Administered 2018-04-14: 1000 mL

## 2018-04-14 MED ORDER — MORPHINE SULFATE (PF) 4 MG/ML IV SOLN
0.5000 mg | INTRAVENOUS | Status: DC | PRN
  Filled 2018-04-14: qty 1

## 2018-04-14 MED ORDER — BUPIVACAINE-EPINEPHRINE (PF) 0.25% -1:200000 IJ SOLN
INTRAMUSCULAR | Status: AC
  Filled 2018-04-14: qty 30

## 2018-04-14 MED ORDER — DEXAMETHASONE SODIUM PHOSPHATE 10 MG/ML IJ SOLN
INTRAMUSCULAR | Status: DC | PRN
  Administered 2018-04-14: 6 mg via INTRAVENOUS

## 2018-04-14 MED ORDER — MELATONIN 3 MG PO TABS
3.0000 mg | ORAL_TABLET | Freq: Every day | ORAL | Status: DC
  Administered 2018-04-14: 3 mg via ORAL
  Filled 2018-04-14: qty 1

## 2018-04-14 MED ORDER — TRAMADOL HCL 50 MG PO TABS
50.0000 mg | ORAL_TABLET | Freq: Four times a day (QID) | ORAL | Status: DC
  Administered 2018-04-14 – 2018-04-15 (×4): 50 mg via ORAL
  Filled 2018-04-14 (×4): qty 1

## 2018-04-14 SURGICAL SUPPLY — 64 items
BIT DRILL 170X2.5X (BIT) IMPLANT
BIT DRILL 5/64X5 DISP (BIT) ×3 IMPLANT
BIT DRL 170X2.5X (BIT)
BLADE SAG 18X100X1.27 (BLADE) ×3 IMPLANT
CAPT SHLDR REVTOTAL 1 ×3 IMPLANT
CEMENT HV SMART SET (Cement) ×3 IMPLANT
CLOSURE WOUND 1/2 X4 (GAUZE/BANDAGES/DRESSINGS) ×1
COVER SURGICAL LIGHT HANDLE (MISCELLANEOUS) ×3 IMPLANT
DECANTER SPIKE VIAL GLASS SM (MISCELLANEOUS) ×3 IMPLANT
DRAPE INCISE IOBAN 66X45 STRL (DRAPES) ×3 IMPLANT
DRAPE ORTHO SPLIT 77X108 STRL (DRAPES) ×4
DRAPE SURG ORHT 6 SPLT 77X108 (DRAPES) ×2 IMPLANT
DRAPE U-SHAPE 47X51 STRL (DRAPES) ×3 IMPLANT
DRILL 2.5 (BIT)
DRSG ADAPTIC 3X8 NADH LF (GAUZE/BANDAGES/DRESSINGS) ×3 IMPLANT
DRSG PAD ABDOMINAL 8X10 ST (GAUZE/BANDAGES/DRESSINGS) ×6 IMPLANT
DURAPREP 26ML APPLICATOR (WOUND CARE) ×3 IMPLANT
ELECT BLADE 4.0 EZ CLEAN MEGAD (MISCELLANEOUS) ×3
ELECT NEEDLE TIP 2.8 STRL (NEEDLE) ×3 IMPLANT
ELECT REM PT RETURN 9FT ADLT (ELECTROSURGICAL) ×3
ELECTRODE BLDE 4.0 EZ CLN MEGD (MISCELLANEOUS) ×1 IMPLANT
ELECTRODE REM PT RTRN 9FT ADLT (ELECTROSURGICAL) ×1 IMPLANT
GAUZE SPONGE 4X4 12PLY STRL (GAUZE/BANDAGES/DRESSINGS) ×3 IMPLANT
GLOVE BIOGEL PI ORTHO PRO 7.5 (GLOVE) ×2
GLOVE BIOGEL PI ORTHO PRO SZ8 (GLOVE) ×2
GLOVE ORTHO TXT STRL SZ7.5 (GLOVE) ×3 IMPLANT
GLOVE PI ORTHO PRO STRL 7.5 (GLOVE) ×1 IMPLANT
GLOVE PI ORTHO PRO STRL SZ8 (GLOVE) ×1 IMPLANT
GLOVE SURG ORTHO 8.5 STRL (GLOVE) ×3 IMPLANT
GOWN STRL REUS W/ TWL LRG LVL3 (GOWN DISPOSABLE) ×1 IMPLANT
GOWN STRL REUS W/ TWL XL LVL3 (GOWN DISPOSABLE) ×2 IMPLANT
GOWN STRL REUS W/TWL LRG LVL3 (GOWN DISPOSABLE) ×2
GOWN STRL REUS W/TWL XL LVL3 (GOWN DISPOSABLE) ×4
KIT BASIN OR (CUSTOM PROCEDURE TRAY) ×3 IMPLANT
KIT TURNOVER KIT B (KITS) ×3 IMPLANT
MANIFOLD NEPTUNE II (INSTRUMENTS) ×3 IMPLANT
NEEDLE 1/2 CIR MAYO (NEEDLE) ×3 IMPLANT
NEEDLE HYPO 25GX1X1/2 BEV (NEEDLE) ×3 IMPLANT
NS IRRIG 1000ML POUR BTL (IV SOLUTION) ×3 IMPLANT
PACK SHOULDER (CUSTOM PROCEDURE TRAY) ×3 IMPLANT
PAD ARMBOARD 7.5X6 YLW CONV (MISCELLANEOUS) ×6 IMPLANT
PIN GUIDE 1.2 (PIN) IMPLANT
PIN GUIDE GLENOPHERE 1.5MX300M (PIN) IMPLANT
PIN METAGLENE 2.5 (PIN) IMPLANT
SLING ARM FOAM STRAP MED (SOFTGOODS) ×3 IMPLANT
SPONGE LAP 18X18 X RAY DECT (DISPOSABLE) IMPLANT
SPONGE LAP 4X18 X RAY DECT (DISPOSABLE) ×3 IMPLANT
STRIP CLOSURE SKIN 1/2X4 (GAUZE/BANDAGES/DRESSINGS) ×2 IMPLANT
SUCTION FRAZIER HANDLE 10FR (MISCELLANEOUS) ×2
SUCTION TUBE FRAZIER 10FR DISP (MISCELLANEOUS) ×1 IMPLANT
SUT FIBERWIRE #2 38 T-5 BLUE (SUTURE) ×6
SUT MNCRL AB 4-0 PS2 18 (SUTURE) ×3 IMPLANT
SUT VIC AB 0 CT1 27 (SUTURE) ×2
SUT VIC AB 0 CT1 27XBRD ANBCTR (SUTURE) ×1 IMPLANT
SUT VIC AB 0 CT2 27 (SUTURE) ×3 IMPLANT
SUT VIC AB 2-0 CT1 27 (SUTURE) ×2
SUT VIC AB 2-0 CT1 TAPERPNT 27 (SUTURE) ×1 IMPLANT
SUT VICRYL 0 CT 1 36IN (SUTURE) IMPLANT
SUTURE FIBERWR #2 38 T-5 BLUE (SUTURE) ×2 IMPLANT
SYR CONTROL 10ML LL (SYRINGE) ×3 IMPLANT
TOWEL OR 17X24 6PK STRL BLUE (TOWEL DISPOSABLE) ×3 IMPLANT
TOWEL OR 17X26 10 PK STRL BLUE (TOWEL DISPOSABLE) ×3 IMPLANT
TOWER CARTRIDGE SMART MIX (DISPOSABLE) ×3 IMPLANT
YANKAUER SUCT BULB TIP NO VENT (SUCTIONS) ×3 IMPLANT

## 2018-04-14 NOTE — Brief Op Note (Signed)
04/14/2018  9:44 AM  PATIENT:  Courtney Williamson  84 y.o. female  PRE-OPERATIVE DIAGNOSIS:  Right shoulder osteoarthritis, rotator cuff tear  POST-OPERATIVE DIAGNOSIS:  Right shoulder osteoarthritis, rotator cuff tear  PROCEDURE:  Procedure(s): REVERSE SHOULDER ARTHROPLASTY (Right) Depuy Delta Xtend  SURGEON:  Surgeon(s) and Role:    Netta Cedars, MD - Primary  PHYSICIAN ASSISTANT:   ASSISTANTS: [place] Bruns, PA-C   ANESTHESIA:   regional and general  EBL:  200 cc   BLOOD ADMINISTERED:none  DRAINS: none   LOCAL MEDICATIONS USED:  MARCAINE     SPECIMEN:  No Specimen  DISPOSITION OF SPECIMEN:  N/A  COUNTS:  YES  TOURNIQUET:  * No tourniquets in log *  DICTATION: .Other Dictation: Dictation Number [id number]  PLAN OF CARE: Admit to inpatient   PATIENT DISPOSITION:  PACU - hemodynamically stable.   Delay start of Pharmacological VTE agent (>24hrs) due to surgical blood loss or risk of bleeding: not applicable

## 2018-04-14 NOTE — Transfer of Care (Signed)
Immediate Anesthesia Transfer of Care Note  Patient: Courtney Williamson  Procedure(s) Performed: REVERSE SHOULDER ARTHROPLASTY (Right Shoulder)  Patient Location: PACU  Anesthesia Type:General  Level of Consciousness: awake, alert  and sedated  Airway & Oxygen Therapy: Patient connected to nasal cannula oxygen  Post-op Assessment: Post -op Vital signs reviewed and stable  Post vital signs: stable  Last Vitals:  Vitals Value Taken Time  BP    Temp    Pulse 91 04/14/2018  9:48 AM  Resp    SpO2 96 % 04/14/2018  9:48 AM  Vitals shown include unvalidated device data.  Last Pain:  Vitals:   04/14/18 0556  TempSrc: Oral  PainSc:       Patients Stated Pain Goal: 3 (37/36/68 1594)  Complications: No apparent anesthesia complications

## 2018-04-14 NOTE — Discharge Instructions (Signed)
Ice to the shoulder constantly.  Keep the incision clean and covered and dry for one week, then ok to get it wet in the shower  Ok to remove the sling in the home and use the right arm for light activity and self care.  Do exercises as instructed 4 times per day.  DO NOT push up with your full weight with the right arm to get out of a chair DO NOT reach behind your back.  Follow up in two weeks in the office, [phone]

## 2018-04-14 NOTE — Anesthesia Procedure Notes (Signed)
Procedure Name: Intubation Date/Time: 04/14/2018 8:00 AM Performed by: Lavell Luster, CRNA Pre-anesthesia Checklist: Patient identified, Emergency Drugs available, Suction available, Patient being monitored and Timeout performed Patient Re-evaluated:Patient Re-evaluated prior to induction Oxygen Delivery Method: Circle system utilized Preoxygenation: Pre-oxygenation with 100% oxygen Induction Type: IV induction Ventilation: Mask ventilation without difficulty Laryngoscope Size: Mac and 3 Grade View: Grade I Tube type: Oral Tube size: 7.0 mm Number of attempts: 1 Airway Equipment and Method: Stylet Placement Confirmation: ETT inserted through vocal cords under direct vision,  positive ETCO2 and breath sounds checked- equal and bilateral Secured at: 21 cm Tube secured with: Tape Dental Injury: Teeth and Oropharynx as per pre-operative assessment

## 2018-04-14 NOTE — Anesthesia Postprocedure Evaluation (Signed)
Anesthesia Post Note  Patient: Courtney Williamson  Procedure(s) Performed: REVERSE SHOULDER ARTHROPLASTY (Right Shoulder)     Patient location during evaluation: PACU Anesthesia Type: General Level of consciousness: oriented and awake and alert Pain management: pain level controlled Vital Signs Assessment: post-procedure vital signs reviewed and stable Respiratory status: spontaneous breathing, respiratory function stable and patient connected to nasal cannula oxygen Cardiovascular status: blood pressure returned to baseline and stable Postop Assessment: no headache, no backache and no apparent nausea or vomiting Anesthetic complications: no    Last Vitals:  Vitals:   04/14/18 1105 04/14/18 1131  BP: 114/71 129/78  Pulse: 84 81  Resp: 13 16  Temp: (!) 36.4 C (!) 36.4 C  SpO2: 92% 92%    Last Pain:  Vitals:   04/14/18 1131  TempSrc: Oral  PainSc:                  Halil Rentz COKER

## 2018-04-14 NOTE — Op Note (Signed)
NAME: Courtney Williamson MEDICAL RECORD [id number] ACCOUNT [id number] DATE OF BIRTH:1934/07/16 FACILITY: [facility] LOCATION: [facility] PHYSICIAN:STEVEN Orlena Sheldon, MD  OPERATIVE REPORT  DATE OF PROCEDURE:  04/14/2018  PREOPERATIVE DIAGNOSIS:  Right shoulder end-stage osteoarthritis and rotator cuff tears, chronic with subscapularis repair.  POSTOPERATIVE DIAGNOSIS:  Right shoulder end-stage osteoarthritis and rotator cuff tears, chronic with subscapularis repair.  PROCEDURE PERFORMED:  Right reverse shoulder replacement using DePuy Delta Xtend prosthesis.  ATTENDING SURGEON:  Esmond Plants, MD   ASSISTANT:  Darol Destine, [place], who was scrubbed during the entire procedure necessary for satisfactory completion of surgery.  ANESTHESIA:  General anesthesia was used plus interscalene block.  ESTIMATED BLOOD LOSS:  Minimal.  FLUID REPLACEMENT:  1200 mL crystalloid.  INSTRUMENT COUNT:  Correct.  COMPLICATIONS:  None.  Perioperative antibiotics were given.    INDICATIONS:  The patient is an 84 year old female with worsening right shoulder pain and dysfunction secondary to chronic rotator cuff tears as well as end-stage arthritis.  The patient has had progressive pain and functional loss despite conservative  management including modification of activities, pain medication and injections.  The patient presents for operative reverse shoulder replacement to restore fixed focal mechanics and restore shoulder function and eliminate pain.  Risks and benefits of  surgery discussed in detail with the patient and family.  Informed consent obtained.  DESCRIPTION OF PROCEDURE:  After an adequate level of anesthesia was achieved, the patient was positioned in the modified beach chair position.  Right shoulder correctly identified and sterilely prepped and draped in the usual manner.  Timeout was  called.  We entered the shoulder using a standard deltopectoral incision starting at the coracoid process  extending down to the anterior humerus.  Dissection down through subcutaneous tissues.  Bovie electrocautery was used to control hemostasis.   Cephalic vein was identified and taken laterally with the deltoid.  Pectoralis was taken medially.  Conjoined tendon identified and retracted medially.  We tenodesed the biceps in-situ with 0 Vicryl figure-of-eight suture x2 incorporating the pec tendon.   We then released the subscapularis subperiosteally off the lesser tuberosity, tagging with #2 FiberWire suture in a modified Mason-Allen suture technique for repair at the end.  Once we had that tendon released off the lesser tuberosity, we did a  progressive external rotation of the humerus and release of the inferior capsule.  We then released the biceps tendon at the rotator interval and extended the shoulder delivering the humeral head out of the wound.  The teres minor was intact.  The  supraspinatus and infraspinatus were completely torn.  End-stage arthritis was encountered.  We entered the proximal humerus with a 6 mm reamer, reamed up to a size 10 mm and then placed a 10 mm intramedullary resection guide for the head resection which  we did at 10 degrees of retroversion with an oscillating saw.  We removed the head for bone grafting.  We next went ahead and subluxed the humerus posteriorly and did our glenoid preparation.  We did a 360 degree glenoid exposure, releasing and removing  the capsule and the glenoid labrum.  We then found the center point for our glenoid and drilled a guide pin.  We then reamed over that for the baseplate.  We then drilled our central peg hole and then impacted our metaglene into position.  We drilled  peripheral holes and placed a 42 locked screw inferiorly at 30 at the base of the coracoid and 18 unlocked posteriorly.  We had excellent baseplate  fixation.  We then went ahead and screwed our glenosphere, it was a 38 standard glenosphere, into  position, impacted and screwed  that down so it was secure.  I did a finger sweep to make sure we had no incarcerated tissue and the axillary nerve was free and clear.  We also made sure that the glenosphere was secure.  We then went ahead and completed  our preparation on the humeral side.  We reamed the epiphysis for the Epi-1 right and then inserted the 10 body with the Epi-1 set on a 0 setting and placed in 10 degrees of retroversion and then placed a 36+3 trial and reduced the shoulder.  We were  pleased with our soft tissue stability.  It felt like we could probably get a +6 in.  We removed all trial components, drilled holes in the lesser tuberosity and placed #2 FiberWire for repair of the subscap.  We irrigated thoroughly.  Upon trialing, I  felt that the trial was a little bit loose and typically when I was concerned about the security of the implants, the press-fit implant, then we used a little bit of cement around the proximal calcar area and basically using a potting technique.  So we  mixed high viscosity cement, placed a little of that around the neck of the humeral implant and then impacted the 10 body Epi-1 right set on a 0 setting and 10 degrees of retroversion.  We had a good secure fixation with that went ahead and then placed a  36+6 real poly into position on the humerus and impacted that in place.  We reduced the shoulder, had a nice little pop going in just with 1 to 2 finger tightness.  We had 30 degrees of extension with the shoulder.  No gapping with inferior pole or  external rotation and a nice tension conjoined.  Axillary nerve was under good tension.  We irrigated thoroughly and repaired the subscapularis anatomically back to the lesser tuberosity using the sutures through drill holes and also additional sutures  through bone in the rotator interval area.  We had a nice subscap repair, could externally rotate 30 degrees, no problem with no gapping and then we irrigated thoroughly again and then repaired the  deltopectoral interval with 0 Vicryl suture followed by  2-0 Vicryl for subcutaneous closure and 4-0 Monocryl for skin.  Steri-Strips were applied followed by sterile dressing.  The patient tolerated surgery well.  AN/NUANCE  D:04/14/2018 T:04/14/2018 JOB:[id number]

## 2018-04-14 NOTE — Anesthesia Procedure Notes (Signed)
Anesthesia Regional Block: Interscalene brachial plexus block   Pre-Anesthetic Checklist: ,, timeout performed, Correct Patient, Correct Site, Correct Laterality, Correct Procedure, Correct Position, site marked, Risks and benefits discussed,  Surgical consent,  Pre-op evaluation,  At surgeon's request and post-op pain management  Laterality: Right  Prep: chloraprep       Needles:  Injection technique: Single-shot  Needle Type: Stimulator Needle - 40      Needle Gauge: 22     Additional Needles:   Procedures:, nerve stimulator,,,,,,,  Narrative:  Start time: 04/14/2018 7:20 AM End time: 04/14/2018 7:25 AM Injection made incrementally with aspirations every 5 mL.  Performed by: Personally  Anesthesiologist: Roberts Gaudy, MD

## 2018-04-14 NOTE — Interval H&P Note (Signed)
History and Physical Interval Note:  04/14/2018 7:33 AM  Courtney Williamson  has presented today for surgery, with the diagnosis of Right shoulder osteoarthritis, rotator cuff tear  The various methods of treatment have been discussed with the patient and family. After consideration of risks, benefits and other options for treatment, the patient has consented to  Procedure(s): REVERSE SHOULDER ARTHROPLASTY (Right) as a surgical intervention .  The patient's history has been reviewed, patient examined, no change in status, stable for surgery.  I have reviewed the patient's chart and labs.  Questions were answered to the patient's satisfaction.     Betta Balla,STEVEN R

## 2018-04-14 NOTE — Anesthesia Preprocedure Evaluation (Signed)
Anesthesia Evaluation  Patient identified by MRN, date of birth, ID band Patient awake    Reviewed: Allergy & Precautions, NPO status , Patient's Chart, lab work & pertinent test results  Airway Mallampati: II  TM Distance: >3 FB Neck ROM: Full    Dental  (+) Teeth Intact, Dental Advisory Given   Pulmonary    breath sounds clear to auscultation       Cardiovascular  Rhythm:Regular Rate:Normal     Neuro/Psych    GI/Hepatic   Endo/Other    Renal/GU      Musculoskeletal   Abdominal   Peds  Hematology   Anesthesia Other Findings   Reproductive/Obstetrics                             Anesthesia Physical Anesthesia Plan  ASA: II  Anesthesia Plan: General   Post-op Pain Management:  Regional for Post-op pain   Induction: Intravenous  PONV Risk Score and Plan: Ondansetron and Dexamethasone  Airway Management Planned: Oral ETT  Additional Equipment:   Intra-op Plan:   Post-operative Plan: Extubation in OR  Informed Consent: I have reviewed the patients History and Physical, chart, labs and discussed the procedure including the risks, benefits and alternatives for the proposed anesthesia with the patient or authorized representative who has indicated his/her understanding and acceptance.     Dental advisory given  Plan Discussed with: CRNA and Anesthesiologist  Anesthesia Plan Comments:         Anesthesia Quick Evaluation  

## 2018-04-15 LAB — BASIC METABOLIC PANEL
Anion gap: 7 (ref 5–15)
BUN: 15 mg/dL (ref 6–20)
CO2: 25 mmol/L (ref 22–32)
CREATININE: 0.99 mg/dL (ref 0.44–1.00)
Calcium: 9 mg/dL (ref 8.9–10.3)
Chloride: 103 mmol/L (ref 101–111)
GFR calc Af Amer: >60 mL/min (ref >60)
GFR calc non Af Amer: 52 mL/min — ABNORMAL LOW (ref >60)
GLUCOSE: 127 mg/dL — AB (ref 65–99)
Potassium: 4.4 mmol/L (ref 3.5–5.1)
SODIUM: 135 mmol/L (ref 135–145)

## 2018-04-15 LAB — HEMOGLOBIN AND HEMATOCRIT, BLOOD
HCT: 39.3 % (ref 36.0–46.0)
Hemoglobin: 12.6 g/dL (ref 12.0–15.0)

## 2018-04-15 MED ORDER — HYDROCODONE-ACETAMINOPHEN 5-325 MG PO TABS: 1.0000 | ORAL_TABLET | ORAL | 0 refills | Status: AC | PRN

## 2018-04-15 NOTE — Progress Notes (Addendum)
Subjective: 1 Day Post-Op Procedure(s) (LRB): REVERSE SHOULDER ARTHROPLASTY (Right) Patient reports pain as moderate.   Block worn off. No numbness or tingling. Pain this morning with block fully worn off. Had been taking tramadol overnight but was not enough this AM needed morphine. No other c/o this AM.  Objective: Vital signs in last 24 hours: Temp:  [95.9 F (35.5 C)-98 F (36.7 C)] 97.7 F (36.5 C) (05/11 0752) Pulse Rate:  [69-95] 73 (05/11 0752) Resp:  [12-20] 16 (05/11 0752) BP: (98-129)/(63-103) 116/72 (05/11 0752) SpO2:  [92 %-96 %] 95 % (05/11 0752)  Intake/Output from previous day: 05/10 0701 - 05/11 0700 In: 1185 [P.O.:360; I.V.:500; IV Piggyback:250] Out: 100 [Urine:100] Intake/Output this shift: No intake/output data recorded.  Recent Labs    04/15/18 0419  HGB 12.6   Recent Labs    04/15/18 0419  HCT 39.3   Recent Labs    04/15/18 0419  NA 135  K 4.4  CL 103  CO2 25  BUN 15  CREATININE 0.99  GLUCOSE 127*  CALCIUM 9.0   No results for input(s): LABPT, INR in the last 72 hours.  Neurologically intact ABD soft Neurovascular intact Sensation intact distally Intact pulses distally Dorsiflexion/Plantar flexion intact Incision: dressing C/D/I and no drainage No cellulitis present Compartment soft no calf pain or sign of DVT  Assessment/Plan: 1 Day Post-Op Procedure(s) (LRB): REVERSE SHOULDER ARTHROPLASTY (Right) Advance diet Up with therapy D/C IV fluids Plan for D/C today Will add Norco for more severe pain prn if needed otherwise tramadol Discussed D/C instructions   BISSELL, JACLYN M. 04/15/2018, 8:57 AM   I have examined the patient and agree with the above assessment and plan

## 2018-04-15 NOTE — Discharge Summary (Signed)
Patient ID: Courtney Williamson MRN: [id number] DOB/AGE: October 25, 1934 84 y.o.  Admit date: 04/14/2018 Discharge date: 04/15/2018  Admission Diagnoses:  Active Problems:   S/P shoulder replacement, right   Discharge Diagnoses:  Same  Past Medical History:  Diagnosis Date  . Allergy   . Arthritis   . Basal cell carcinoma    on nose  . Chicken pox   . Depression   . GERD (gastroesophageal reflux disease)   . Hyperlipidemia   . Urinary tract infection, chronic    on antibiotic now    Surgeries: Procedure(s): REVERSE SHOULDER ARTHROPLASTY on 04/14/2018   Consultants:   Discharged Condition: Improved  Hospital Course: Courtney Williamson is an 84 y.o. female who was admitted 04/14/2018 for operative treatment of<principal problem not specified>. Patient has severe unremitting pain that affects sleep, daily activities, and work/hobbies. After pre-op clearance the patient was taken to the operating room on 04/14/2018 and underwent  Procedure(s): [place].    Patient was given perioperative antibiotics:  Anti-infectives (From admission, onward)   Start     Dose/Rate Route Frequency Ordered Stop   04/15/18 1000  cephALEXin (KEFLEX) capsule 250 mg     250 mg Oral Daily 04/14/18 1150     04/14/18 1400  clindamycin (CLEOCIN) IVPB 600 mg     600 mg 100 mL/hr over 30 Minutes Intravenous Every 6 hours 04/14/18 1150 04/15/18 0423   04/14/18 0600  clindamycin (CLEOCIN) IVPB 900 mg     900 mg 100 mL/hr over 30 Minutes Intravenous To ShortStay Surgical 04/13/18 1029 04/14/18 0800       Patient was given sequential compression devices, early ambulation, and chemoprophylaxis to prevent DVT.  Patient benefited maximally from hospital stay and there were no complications.    Recent vital signs:  Patient Vitals for the past 24 hrs:  BP Temp Temp src Pulse Resp SpO2  04/15/18 0752 116/72 97.7 F (36.5 C) Oral 73 16 95 %  04/15/18 0415 108/73 97.7 F (36.5 C) Oral 71 16 95  %  04/14/18 2326 106/69 97.9 F (36.6 C) Oral 69 16 96 %  04/14/18 1953 120/86 98 F (36.7 C) Oral 88 16 92 %  04/14/18 1621 98/87 (!) 97.5 F (36.4 C) Oral 95 18 93 %  04/14/18 1331 113/68 97.7 F (36.5 C) Oral 81 16 93 %  04/14/18 1131 129/78 (!) 97.5 F (36.4 C) Oral 81 16 92 %  04/14/18 1105 114/71 (!) 97.5 F (36.4 C) - 84 13 92 %  04/14/18 1050 111/72 - - 81 12 94 %  04/14/18 1044 (!) 126/103 (!) 97.5 F (36.4 C) - 82 15 94 %  04/14/18 1035 113/67 - - 83 13 93 %  04/14/18 1020 107/77 - - 84 17 96 %  04/14/18 1005 117/63 (!) 97.5 F (36.4 C) - 86 18 95 %  04/14/18 0950 127/69 (!) 95.9 F (35.5 C) - 88 20 96 %     Recent laboratory studies:  Recent Labs    04/15/18 0419  HGB 12.6  HCT 39.3  NA 135  K 4.4  CL 103  CO2 25  BUN 15  CREATININE 0.99  GLUCOSE 127*  CALCIUM 9.0     Discharge Medications:   Allergies as of 04/15/2018      Reactions   Penicillins Rash, Other (See Comments)   PATIENT HAS HAD A PCN REACTION WITH IMMEDIATE RASH, FACIAL/TONGUE/THROAT SWELLING, SOB, OR LIGHTHEADEDNESS WITH HYPOTENSION:  #  #  YES  #  #  HAS PT DEVELOPED SEVERE RASH INVOLVING MUCUS MEMBRANES or SKIN NECROSIS: #  #  YES  #  # Has patient had a PCN reaction that required hospitalization: N Has patient had a PCN reaction occurring within the last 10 years: N   Sulfa Antibiotics Hives   Codeine Other (See Comments)   "burns a hole in my stomach"   Latex Rash   "breakout"   Nitrofurantoin Rash   whelps   Tetracycline Rash   whelps      Medication List    TAKE these medications   acetaminophen 500 MG tablet Commonly known as:  TYLENOL Take 500 mg by mouth daily as needed for moderate pain or headache.   amLODipine 5 MG tablet Commonly known as:  NORVASC Take 5 mg by mouth daily.   cephALEXin 250 MG capsule Commonly known as:  KEFLEX Take 250 mg by mouth daily.   CITRACAL + D PO Take 1 tablet by mouth 2 (two) times daily.   Co Q-10 200 MG Caps Take 200 mg  by mouth daily.   CRANBERRY PO Take 4,200 mg by mouth daily.   esomeprazole 40 MG capsule Commonly known as:  NEXIUM Take 40 mg by mouth daily at 12 noon.   Garlic 6295 MG Caps Take 1,000 mg by mouth daily.   Glucosamine HCl 1500 MG Tabs Take 1,500 mg by mouth daily.   HYDROcodone-acetaminophen 5-325 MG tablet Commonly known as:  NORCO/VICODIN Take 1 tablet by mouth every 4 (four) hours as needed for severe pain.   L-Lysine 500 MG Caps Take 500 mg by mouth daily.   MELATONIN PO Take 2 mg by mouth at bedtime.   PROBIOTIC PO Take 1 capsule by mouth daily.   psyllium 58.6 % packet Commonly known as:  METAMUCIL Take 1 packet by mouth every other day.   SYSTANE OP Place 1 drop into both eyes daily as needed (dry eyes).   traMADol 50 MG tablet Commonly known as:  ULTRAM Take 1 tablet (50 mg total) by mouth every 6 (six) hours as needed for moderate pain.   triamcinolone cream 0.1 % Commonly known as:  KENALOG APPLY TO AFFECTED AREA TWICE A DAY AS NEEDED FOR ROSACEA   vitamin B-1 250 MG tablet Take 250 mg by mouth daily.   vitamin B-12 1000 MCG tablet Commonly known as:  CYANOCOBALAMIN Take 1,000 mcg by mouth daily.       Diagnostic Studies: Dg Shoulder Right Port  Result Date: 04/14/2018 CLINICAL DATA:  84 year old female with a history of right shoulder replacement EXAM: PORTABLE RIGHT SHOULDER COMPARISON:  01/27/2018 FINDINGS: Surgical changes of right shoulder arthroplasty. Components appear congruent. No fracture identified. Soft tissue swelling at the surgical site. IMPRESSION: Early surgical changes of right shoulder arthroplasty, without complicating features. Electronically Signed   By: Corrie Mckusick D.O.   On: 04/14/2018 12:50    Disposition: Discharge disposition: 01-Home or Self Care       Discharge Instructions    Call MD / Call 911   Complete by:  As directed    If you experience chest pain or shortness of breath, CALL 911 and be transported  to the hospital emergency room.  If you develope a fever above 101 F, pus (white drainage) or increased drainage or redness at the wound, or calf pain, call your surgeon's office.   Constipation Prevention   Complete by:  As directed    Drink plenty of fluids.  Prune juice may be helpful.  You  may use a stool softener, such as Colace (over the counter) 100 mg twice a day.  Use MiraLax (over the counter) for constipation as needed.   Diet - low sodium heart healthy   Complete by:  As directed    Increase activity slowly as tolerated   Complete by:  As directed       Follow-up Information    Netta Cedars, MD. Call in 2 weeks.   Specialty:  Orthopedic Surgery Why:  [phone] Contact information: [address] [place] [address] [phone]            Signed: Cecilie Kicks. 04/15/2018, 9:03 AM

## 2018-04-15 NOTE — Evaluation (Signed)
Occupational Therapy Evaluation and Discharge Patient Details Name: Courtney Williamson MRN: [id number] DOB: 08-22-1934 Today's Date: 04/15/2018    History of Present Illness Pt is an 84 y.o. female s/p R reverse total shoulder arthroplasty. PMH significant for allergy, basal cell carcinoma, chicken pox, depression, GERD, hyperlipidemia, and chronic urinary tract infection.   Clinical Impression   PTA, pt was independent with ADL and functional mobility. She lives alone but will have assistance from her daughter post-acute D/C initially. Pt currently requiring mod assist for UB dressing tasks, min assist for UB bathing tasks, and min assist for LB ADL while adhering to conservative shoulder protocol precautions. Pt educated concerning compensatory ADL strategies for dressing and bathing, shoulder precautions as detailed below, elbow/wrist/hand AROM exercises, sling wear schedule, and ADL transfers. Handouts provided for further education. Pt verbalizes and demonstrates understanding of all education topics. No further acute OT needs identified at this time and will sign off. Recommend progression of shoulder rehabilitation per surgeon.     Follow Up Recommendations  Follow surgeon's recommendation for DC plan and follow-up therapies;Supervision/Assistance - 24 hour    Equipment Recommendations  None recommended by OT    Recommendations for Other Services       Precautions / Restrictions Precautions Precautions: Shoulder Type of Shoulder Precautions: Conservative protocol: NWB R shoulder, no AROM/PROM R shoulder, AROM elbow/wrist/hand to tolerance, sling at all times except dressing/bathing/exercise, gentle ADL ok.  Shoulder Interventions: Shoulder sling/immobilizer;Off for dressing/bathing/exercises Precaution Booklet Issued: Yes (comment) Precaution Comments: Reviewed precautions in detail with pt and provided handouts.  Required Braces or Orthoses: Sling Restrictions Weight Bearing  Restrictions: Yes RUE Weight Bearing: Non weight bearing      Mobility Bed Mobility Overal bed mobility: Modified Independent                Transfers Overall transfer level: Needs assistance   Transfers: Sit to/from Stand Sit to Stand: Supervision         General transfer comment: Supervision for safety.     Balance Overall balance assessment: No apparent balance deficits (not formally assessed)                                         ADL either performed or assessed with clinical judgement   ADL Overall ADL's : Needs assistance/impaired Eating/Feeding: Set up;Sitting   Grooming: Supervision/safety;Standing   Upper Body Bathing: Sitting;Minimal assistance   Lower Body Bathing: Minimal assistance;Sit to/from stand   Upper Body Dressing : Moderate assistance;Sitting Upper Body Dressing Details (indicate cue type and reason): including sling Lower Body Dressing: Minimal assistance;Sit to/from stand   Toilet Transfer: Supervision/safety;Ambulation   Toileting- Clothing Manipulation and Hygiene: Supervision/safety;Sit to/from stand   Tub/ Shower Transfer: Supervision/safety;Ambulation   Functional mobility during ADLs: Supervision/safety General ADL Comments: Pt educated concerning compensatory ADL strategies for UB ADL as well as conservative shoulder protocol precautions and sling wear schedule.      Vision Baseline Vision/History: Wears glasses Wears Glasses: At all times Patient Visual Report: No change from baseline Vision Assessment?: No apparent visual deficits     Perception     Praxis      Pertinent Vitals/Pain Pain Assessment: Faces Faces Pain Scale: Hurts even more Pain Location: R shoulder Pain Descriptors / Indicators: Aching;Operative site guarding;Sore Pain Intervention(s): Limited activity within patient's tolerance;Monitored during session;Repositioned     Hand Dominance Right   Extremity/Trunk Assessment Upper  Extremity Assessment Upper Extremity Assessment: RUE deficits/detail RUE Deficits / Details: Decreased sensation at shoulder and upper arm. Elbow/wrist/hand AROM and sensation in tact.  RUE: Unable to fully assess due to immobilization   Lower Extremity Assessment Lower Extremity Assessment: Overall WFL for tasks assessed       Communication Communication Communication: No difficulties   Cognition Arousal/Alertness: Awake/alert Behavior During Therapy: WFL for tasks assessed/performed Overall Cognitive Status: Within Functional Limits for tasks assessed                                     General Comments       Exercises Exercises: Shoulder Shoulder Exercises Elbow Flexion: AROM;Right;10 reps;Standing Elbow Extension: AROM;Right;10 reps;Standing Wrist Flexion: AROM;Right;10 reps;Seated Wrist Extension: AROM;Right;10 reps;Seated Digit Composite Flexion: AROM;Right;Both;10 reps;Seated Composite Extension: AROM;Right;Both;10 reps;Seated   Shoulder Instructions Shoulder Instructions Donning/doffing shirt without moving shoulder: Moderate assistance Method for sponge bathing under operated UE: Minimal assistance Donning/doffing sling/immobilizer: Moderate assistance Correct positioning of sling/immobilizer: Supervision/safety Pendulum exercises (written home exercise program): (n/a) ROM for elbow, wrist and digits of operated UE: Supervision/safety Sling wearing schedule (on at all times/off for ADL's): Supervision/safety Proper positioning of operated UE when showering: Supervision/safety Positioning of UE while sleeping: [place] expects to be discharged to:: Private residence Living Arrangements: Alone Available Help at Discharge: Family;Available 24 hours/day(daughter for approximately 1 week) Type of Home: House Home Access: Level entry     Home Layout: One level     Bathroom Shower/Tub: Medical illustrator: (comfort height)     Home Equipment: Shower seat;Grab bars - tub/shower;Hand held shower head(lift chair)          Prior Functioning/Environment Level of Independence: Independent                 OT Problem List: Decreased strength;Decreased activity tolerance;Impaired balance (sitting and/or standing);Decreased safety awareness;Decreased knowledge of use of DME or AE;Decreased knowledge of precautions;Pain;Impaired UE functional use      OT Treatment/Interventions:      OT Goals(Current goals can be found in the care plan section) Acute Rehab OT Goals Patient Stated Goal: to get back to independence OT Goal Formulation: With patient  OT Frequency:     Barriers to D/C:            Co-evaluation              AM-PAC PT "6 Clicks" Daily Activity     Outcome Measure Help from another person eating meals?: A Little Help from another person taking care of personal grooming?: A Little Help from another person toileting, which includes using toliet, bedpan, or urinal?: A Little Help from another person bathing (including washing, rinsing, drying)?: A Little Help from another person to put on and taking off regular upper body clothing?: A Lot Help from another person to put on and taking off regular lower body clothing?: A Little 6 Click Score: 17   End of Session Equipment Utilized During Treatment: (R shoulder sling) Nurse Communication: Mobility status  Activity Tolerance: Patient tolerated treatment well Patient left: in chair;with call bell/phone within reach  OT Visit Diagnosis: Other abnormalities of gait and mobility (R26.89);Pain Pain - Right/Left: Right Pain - part of body: Shoulder                Time: 0630-1601 OT Time Calculation (min): 49 min Charges:  OT General  Charges $OT Visit: 1 Visit OT Evaluation $OT Eval Moderate Complexity: 1 Mod OT Treatments $Self Care/Home Management : 8-22 mins $Therapeutic Exercise: 8-22  mins G-Codes:     Norman Herrlich, MS OTR/L  Pager: [place] A Saahir Prude 04/15/2018, 9:30 AM

## 2018-04-17 ENCOUNTER — Encounter (HOSPITAL_COMMUNITY): Payer: Self-pay | Admitting: Orthopedic Surgery

## 2018-04-24 DIAGNOSIS — H2513 Age-related nuclear cataract, bilateral: Secondary | ICD-10-CM | POA: Diagnosis not present

## 2018-04-27 DIAGNOSIS — Z4789 Encounter for other orthopedic aftercare: Secondary | ICD-10-CM | POA: Diagnosis not present

## 2018-05-04 DIAGNOSIS — H2513 Age-related nuclear cataract, bilateral: Secondary | ICD-10-CM | POA: Diagnosis not present

## 2018-05-04 DIAGNOSIS — H5203 Hypermetropia, bilateral: Secondary | ICD-10-CM | POA: Diagnosis not present

## 2018-05-22 DIAGNOSIS — M5136 Other intervertebral disc degeneration, lumbar region: Secondary | ICD-10-CM | POA: Diagnosis not present

## 2018-05-22 DIAGNOSIS — M9905 Segmental and somatic dysfunction of pelvic region: Secondary | ICD-10-CM | POA: Diagnosis not present

## 2018-05-22 DIAGNOSIS — M955 Acquired deformity of pelvis: Secondary | ICD-10-CM | POA: Diagnosis not present

## 2018-05-22 DIAGNOSIS — M9903 Segmental and somatic dysfunction of lumbar region: Secondary | ICD-10-CM | POA: Diagnosis not present

## 2018-05-25 DIAGNOSIS — Z4789 Encounter for other orthopedic aftercare: Secondary | ICD-10-CM | POA: Diagnosis not present

## 2018-06-05 DIAGNOSIS — M9905 Segmental and somatic dysfunction of pelvic region: Secondary | ICD-10-CM | POA: Diagnosis not present

## 2018-06-05 DIAGNOSIS — M5136 Other intervertebral disc degeneration, lumbar region: Secondary | ICD-10-CM | POA: Diagnosis not present

## 2018-06-05 DIAGNOSIS — M9903 Segmental and somatic dysfunction of lumbar region: Secondary | ICD-10-CM | POA: Diagnosis not present

## 2018-06-05 DIAGNOSIS — M955 Acquired deformity of pelvis: Secondary | ICD-10-CM | POA: Diagnosis not present

## 2018-06-19 DIAGNOSIS — M9905 Segmental and somatic dysfunction of pelvic region: Secondary | ICD-10-CM | POA: Diagnosis not present

## 2018-06-19 DIAGNOSIS — M955 Acquired deformity of pelvis: Secondary | ICD-10-CM | POA: Diagnosis not present

## 2018-06-19 DIAGNOSIS — M9903 Segmental and somatic dysfunction of lumbar region: Secondary | ICD-10-CM | POA: Diagnosis not present

## 2018-06-19 DIAGNOSIS — M5136 Other intervertebral disc degeneration, lumbar region: Secondary | ICD-10-CM | POA: Diagnosis not present

## 2018-07-03 DIAGNOSIS — M9903 Segmental and somatic dysfunction of lumbar region: Secondary | ICD-10-CM | POA: Diagnosis not present

## 2018-07-03 DIAGNOSIS — M9905 Segmental and somatic dysfunction of pelvic region: Secondary | ICD-10-CM | POA: Diagnosis not present

## 2018-07-03 DIAGNOSIS — M5136 Other intervertebral disc degeneration, lumbar region: Secondary | ICD-10-CM | POA: Diagnosis not present

## 2018-07-03 DIAGNOSIS — M955 Acquired deformity of pelvis: Secondary | ICD-10-CM | POA: Diagnosis not present

## 2018-07-06 DIAGNOSIS — Z4789 Encounter for other orthopedic aftercare: Secondary | ICD-10-CM | POA: Diagnosis not present

## 2018-07-06 DIAGNOSIS — Z9660 Presence of unspecified orthopedic joint implant: Secondary | ICD-10-CM | POA: Diagnosis not present

## 2018-07-20 DIAGNOSIS — J309 Allergic rhinitis, unspecified: Secondary | ICD-10-CM | POA: Diagnosis not present

## 2018-07-20 DIAGNOSIS — L308 Other specified dermatitis: Secondary | ICD-10-CM | POA: Diagnosis not present

## 2018-07-20 DIAGNOSIS — N183 Chronic kidney disease, stage 3 (moderate): Secondary | ICD-10-CM | POA: Diagnosis not present

## 2018-07-20 DIAGNOSIS — N390 Urinary tract infection, site not specified: Secondary | ICD-10-CM | POA: Diagnosis not present

## 2018-07-20 DIAGNOSIS — I10 Essential (primary) hypertension: Secondary | ICD-10-CM | POA: Diagnosis not present

## 2018-07-20 DIAGNOSIS — E7800 Pure hypercholesterolemia, unspecified: Secondary | ICD-10-CM | POA: Diagnosis not present

## 2018-07-20 DIAGNOSIS — K219 Gastro-esophageal reflux disease without esophagitis: Secondary | ICD-10-CM | POA: Diagnosis not present

## 2018-07-24 DIAGNOSIS — M5136 Other intervertebral disc degeneration, lumbar region: Secondary | ICD-10-CM | POA: Diagnosis not present

## 2018-07-24 DIAGNOSIS — M955 Acquired deformity of pelvis: Secondary | ICD-10-CM | POA: Diagnosis not present

## 2018-07-24 DIAGNOSIS — M9903 Segmental and somatic dysfunction of lumbar region: Secondary | ICD-10-CM | POA: Diagnosis not present

## 2018-07-24 DIAGNOSIS — M9905 Segmental and somatic dysfunction of pelvic region: Secondary | ICD-10-CM | POA: Diagnosis not present

## 2018-08-14 DIAGNOSIS — M5136 Other intervertebral disc degeneration, lumbar region: Secondary | ICD-10-CM | POA: Diagnosis not present

## 2018-08-14 DIAGNOSIS — M9903 Segmental and somatic dysfunction of lumbar region: Secondary | ICD-10-CM | POA: Diagnosis not present

## 2018-08-14 DIAGNOSIS — M9905 Segmental and somatic dysfunction of pelvic region: Secondary | ICD-10-CM | POA: Diagnosis not present

## 2018-08-14 DIAGNOSIS — M955 Acquired deformity of pelvis: Secondary | ICD-10-CM | POA: Diagnosis not present

## 2018-09-04 DIAGNOSIS — M955 Acquired deformity of pelvis: Secondary | ICD-10-CM | POA: Diagnosis not present

## 2018-09-04 DIAGNOSIS — M9903 Segmental and somatic dysfunction of lumbar region: Secondary | ICD-10-CM | POA: Diagnosis not present

## 2018-09-04 DIAGNOSIS — M9905 Segmental and somatic dysfunction of pelvic region: Secondary | ICD-10-CM | POA: Diagnosis not present

## 2018-09-04 DIAGNOSIS — M5136 Other intervertebral disc degeneration, lumbar region: Secondary | ICD-10-CM | POA: Diagnosis not present

## 2018-09-25 DIAGNOSIS — M5136 Other intervertebral disc degeneration, lumbar region: Secondary | ICD-10-CM | POA: Diagnosis not present

## 2018-09-25 DIAGNOSIS — M955 Acquired deformity of pelvis: Secondary | ICD-10-CM | POA: Diagnosis not present

## 2018-09-25 DIAGNOSIS — M9903 Segmental and somatic dysfunction of lumbar region: Secondary | ICD-10-CM | POA: Diagnosis not present

## 2018-09-25 DIAGNOSIS — M9905 Segmental and somatic dysfunction of pelvic region: Secondary | ICD-10-CM | POA: Diagnosis not present

## 2018-10-05 DIAGNOSIS — Z4789 Encounter for other orthopedic aftercare: Secondary | ICD-10-CM | POA: Diagnosis not present

## 2018-10-05 DIAGNOSIS — Z9660 Presence of unspecified orthopedic joint implant: Secondary | ICD-10-CM | POA: Diagnosis not present

## 2018-10-18 DIAGNOSIS — M9903 Segmental and somatic dysfunction of lumbar region: Secondary | ICD-10-CM | POA: Diagnosis not present

## 2018-10-18 DIAGNOSIS — M955 Acquired deformity of pelvis: Secondary | ICD-10-CM | POA: Diagnosis not present

## 2018-10-18 DIAGNOSIS — M5136 Other intervertebral disc degeneration, lumbar region: Secondary | ICD-10-CM | POA: Diagnosis not present

## 2018-10-18 DIAGNOSIS — M9905 Segmental and somatic dysfunction of pelvic region: Secondary | ICD-10-CM | POA: Diagnosis not present

## 2018-11-08 DIAGNOSIS — M5136 Other intervertebral disc degeneration, lumbar region: Secondary | ICD-10-CM | POA: Diagnosis not present

## 2018-11-08 DIAGNOSIS — M955 Acquired deformity of pelvis: Secondary | ICD-10-CM | POA: Diagnosis not present

## 2018-11-08 DIAGNOSIS — M9905 Segmental and somatic dysfunction of pelvic region: Secondary | ICD-10-CM | POA: Diagnosis not present

## 2018-11-08 DIAGNOSIS — M9903 Segmental and somatic dysfunction of lumbar region: Secondary | ICD-10-CM | POA: Diagnosis not present

## 2018-12-05 DIAGNOSIS — M5136 Other intervertebral disc degeneration, lumbar region: Secondary | ICD-10-CM | POA: Diagnosis not present

## 2018-12-05 DIAGNOSIS — M9903 Segmental and somatic dysfunction of lumbar region: Secondary | ICD-10-CM | POA: Diagnosis not present

## 2018-12-05 DIAGNOSIS — M9905 Segmental and somatic dysfunction of pelvic region: Secondary | ICD-10-CM | POA: Diagnosis not present

## 2018-12-05 DIAGNOSIS — M955 Acquired deformity of pelvis: Secondary | ICD-10-CM | POA: Diagnosis not present

## 2019-01-03 DIAGNOSIS — M9903 Segmental and somatic dysfunction of lumbar region: Secondary | ICD-10-CM | POA: Diagnosis not present

## 2019-01-03 DIAGNOSIS — M955 Acquired deformity of pelvis: Secondary | ICD-10-CM | POA: Diagnosis not present

## 2019-01-03 DIAGNOSIS — M9905 Segmental and somatic dysfunction of pelvic region: Secondary | ICD-10-CM | POA: Diagnosis not present

## 2019-01-03 DIAGNOSIS — M5136 Other intervertebral disc degeneration, lumbar region: Secondary | ICD-10-CM | POA: Diagnosis not present

## 2019-01-24 DIAGNOSIS — R251 Tremor, unspecified: Secondary | ICD-10-CM | POA: Diagnosis not present

## 2019-01-24 DIAGNOSIS — K219 Gastro-esophageal reflux disease without esophagitis: Secondary | ICD-10-CM | POA: Diagnosis not present

## 2019-01-24 DIAGNOSIS — N183 Chronic kidney disease, stage 3 (moderate): Secondary | ICD-10-CM | POA: Diagnosis not present

## 2019-01-24 DIAGNOSIS — N390 Urinary tract infection, site not specified: Secondary | ICD-10-CM | POA: Diagnosis not present

## 2019-01-24 DIAGNOSIS — J309 Allergic rhinitis, unspecified: Secondary | ICD-10-CM | POA: Diagnosis not present

## 2019-01-24 DIAGNOSIS — E7800 Pure hypercholesterolemia, unspecified: Secondary | ICD-10-CM | POA: Diagnosis not present

## 2019-01-24 DIAGNOSIS — I10 Essential (primary) hypertension: Secondary | ICD-10-CM | POA: Diagnosis not present

## 2019-01-31 DIAGNOSIS — M9905 Segmental and somatic dysfunction of pelvic region: Secondary | ICD-10-CM | POA: Diagnosis not present

## 2019-01-31 DIAGNOSIS — M5136 Other intervertebral disc degeneration, lumbar region: Secondary | ICD-10-CM | POA: Diagnosis not present

## 2019-01-31 DIAGNOSIS — M9903 Segmental and somatic dysfunction of lumbar region: Secondary | ICD-10-CM | POA: Diagnosis not present

## 2019-01-31 DIAGNOSIS — M955 Acquired deformity of pelvis: Secondary | ICD-10-CM | POA: Diagnosis not present

## 2019-03-06 DIAGNOSIS — M9905 Segmental and somatic dysfunction of pelvic region: Secondary | ICD-10-CM | POA: Diagnosis not present

## 2019-03-06 DIAGNOSIS — M5136 Other intervertebral disc degeneration, lumbar region: Secondary | ICD-10-CM | POA: Diagnosis not present

## 2019-03-06 DIAGNOSIS — M9903 Segmental and somatic dysfunction of lumbar region: Secondary | ICD-10-CM | POA: Diagnosis not present

## 2019-03-06 DIAGNOSIS — M955 Acquired deformity of pelvis: Secondary | ICD-10-CM | POA: Diagnosis not present

## 2019-04-03 DIAGNOSIS — M5136 Other intervertebral disc degeneration, lumbar region: Secondary | ICD-10-CM | POA: Diagnosis not present

## 2019-04-03 DIAGNOSIS — M9905 Segmental and somatic dysfunction of pelvic region: Secondary | ICD-10-CM | POA: Diagnosis not present

## 2019-04-03 DIAGNOSIS — M9903 Segmental and somatic dysfunction of lumbar region: Secondary | ICD-10-CM | POA: Diagnosis not present

## 2019-04-03 DIAGNOSIS — M955 Acquired deformity of pelvis: Secondary | ICD-10-CM | POA: Diagnosis not present

## 2019-05-01 DIAGNOSIS — M9905 Segmental and somatic dysfunction of pelvic region: Secondary | ICD-10-CM | POA: Diagnosis not present

## 2019-05-01 DIAGNOSIS — M5136 Other intervertebral disc degeneration, lumbar region: Secondary | ICD-10-CM | POA: Diagnosis not present

## 2019-05-01 DIAGNOSIS — M9903 Segmental and somatic dysfunction of lumbar region: Secondary | ICD-10-CM | POA: Diagnosis not present

## 2019-05-01 DIAGNOSIS — M955 Acquired deformity of pelvis: Secondary | ICD-10-CM | POA: Diagnosis not present

## 2019-05-03 DIAGNOSIS — N3020 Other chronic cystitis without hematuria: Secondary | ICD-10-CM | POA: Diagnosis not present

## 2019-05-03 DIAGNOSIS — R350 Frequency of micturition: Secondary | ICD-10-CM | POA: Diagnosis not present

## 2019-05-08 DIAGNOSIS — H524 Presbyopia: Secondary | ICD-10-CM | POA: Diagnosis not present

## 2019-05-08 DIAGNOSIS — H2513 Age-related nuclear cataract, bilateral: Secondary | ICD-10-CM | POA: Diagnosis not present

## 2019-05-14 DIAGNOSIS — L249 Irritant contact dermatitis, unspecified cause: Secondary | ICD-10-CM | POA: Diagnosis not present

## 2019-05-29 DIAGNOSIS — M9903 Segmental and somatic dysfunction of lumbar region: Secondary | ICD-10-CM | POA: Diagnosis not present

## 2019-05-29 DIAGNOSIS — M9905 Segmental and somatic dysfunction of pelvic region: Secondary | ICD-10-CM | POA: Diagnosis not present

## 2019-05-29 DIAGNOSIS — M955 Acquired deformity of pelvis: Secondary | ICD-10-CM | POA: Diagnosis not present

## 2019-05-29 DIAGNOSIS — M5136 Other intervertebral disc degeneration, lumbar region: Secondary | ICD-10-CM | POA: Diagnosis not present

## 2019-06-05 DIAGNOSIS — R69 Illness, unspecified: Secondary | ICD-10-CM | POA: Diagnosis not present

## 2019-06-12 IMAGING — CR DG HUMERUS 2V *R*
2 series · 2 of 2 positions shown · non-contrast
Comparison: None.

CLINICAL DATA: Fall.  Upper shoulder pain.

EXAM:
RIGHT HUMERUS - 2+ VIEW

[w humerus ap right]
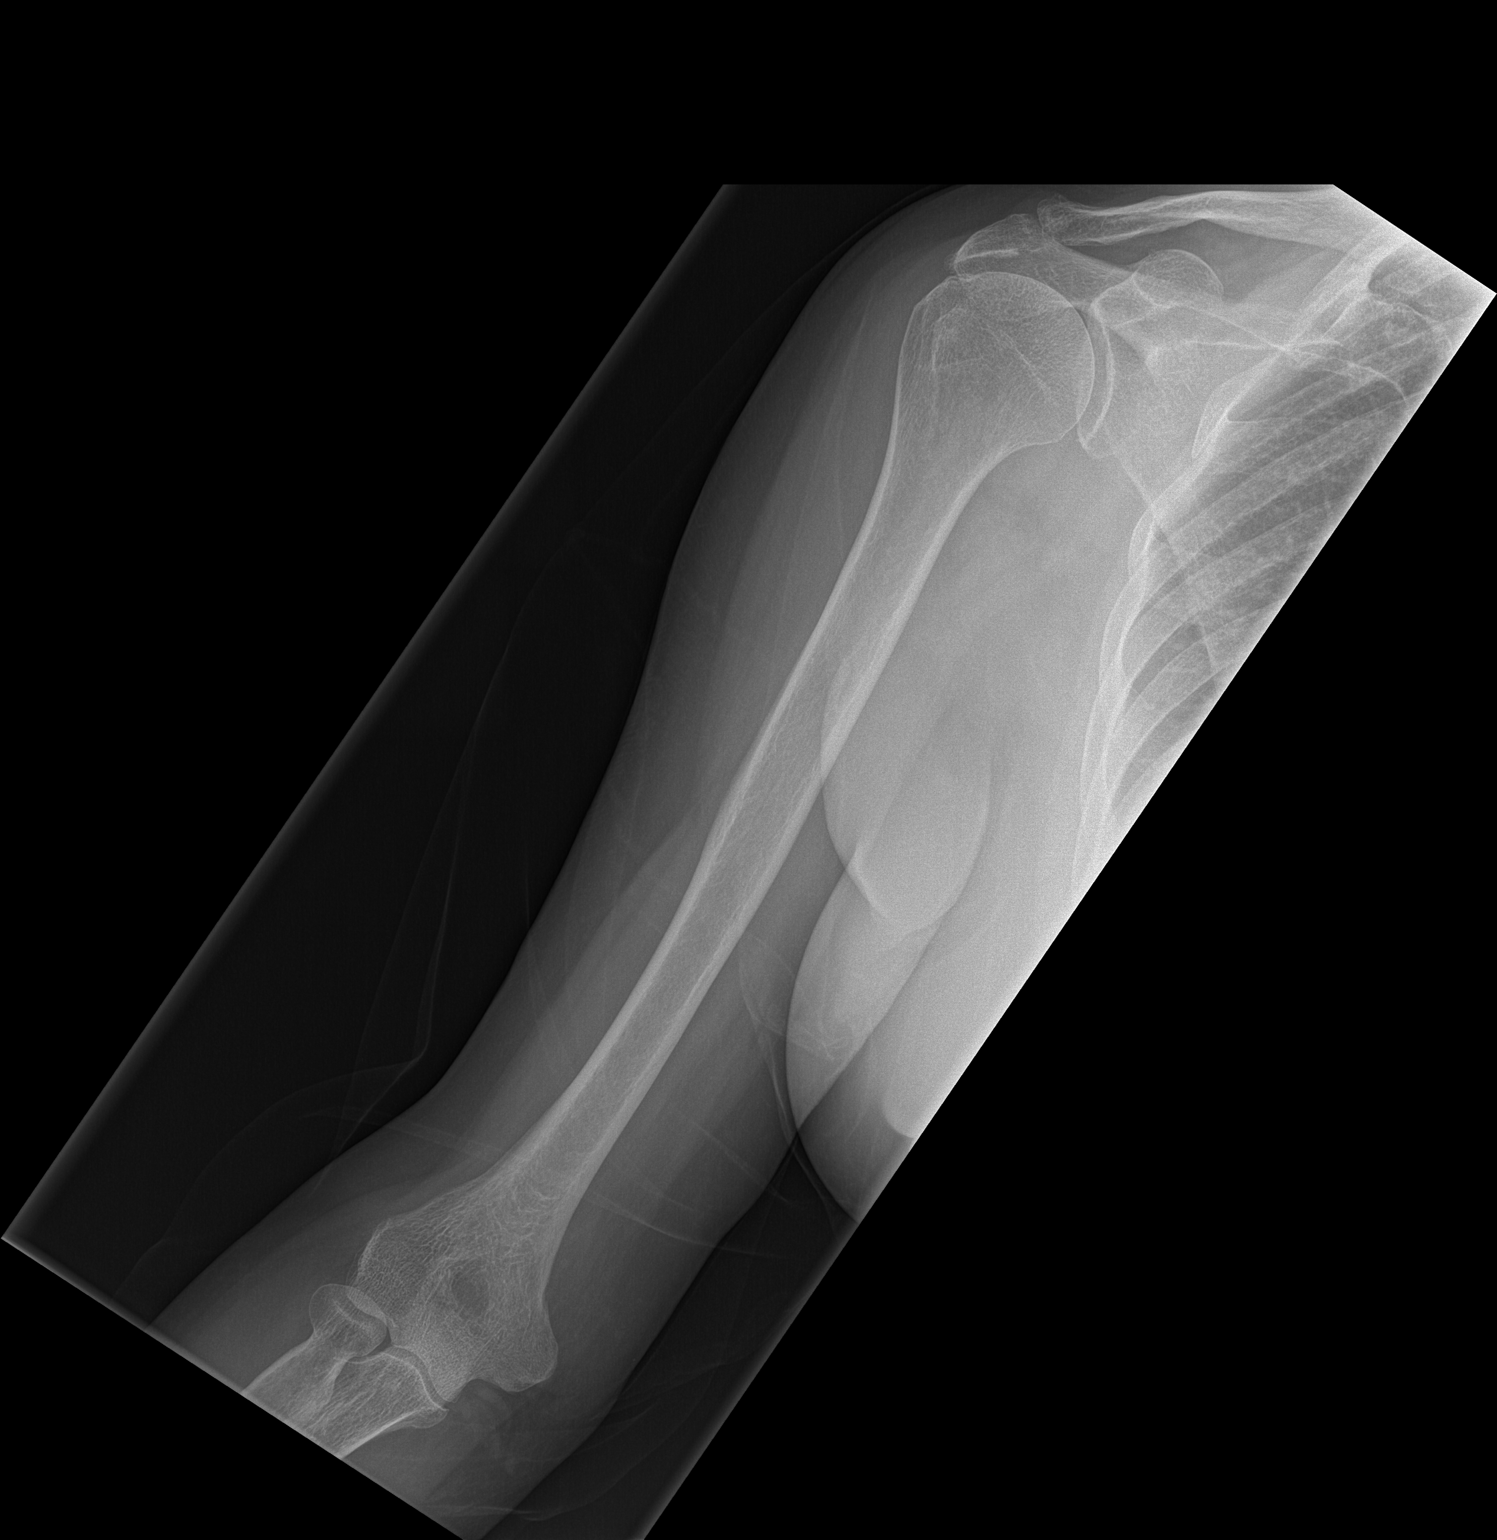

[w humerus lat right]
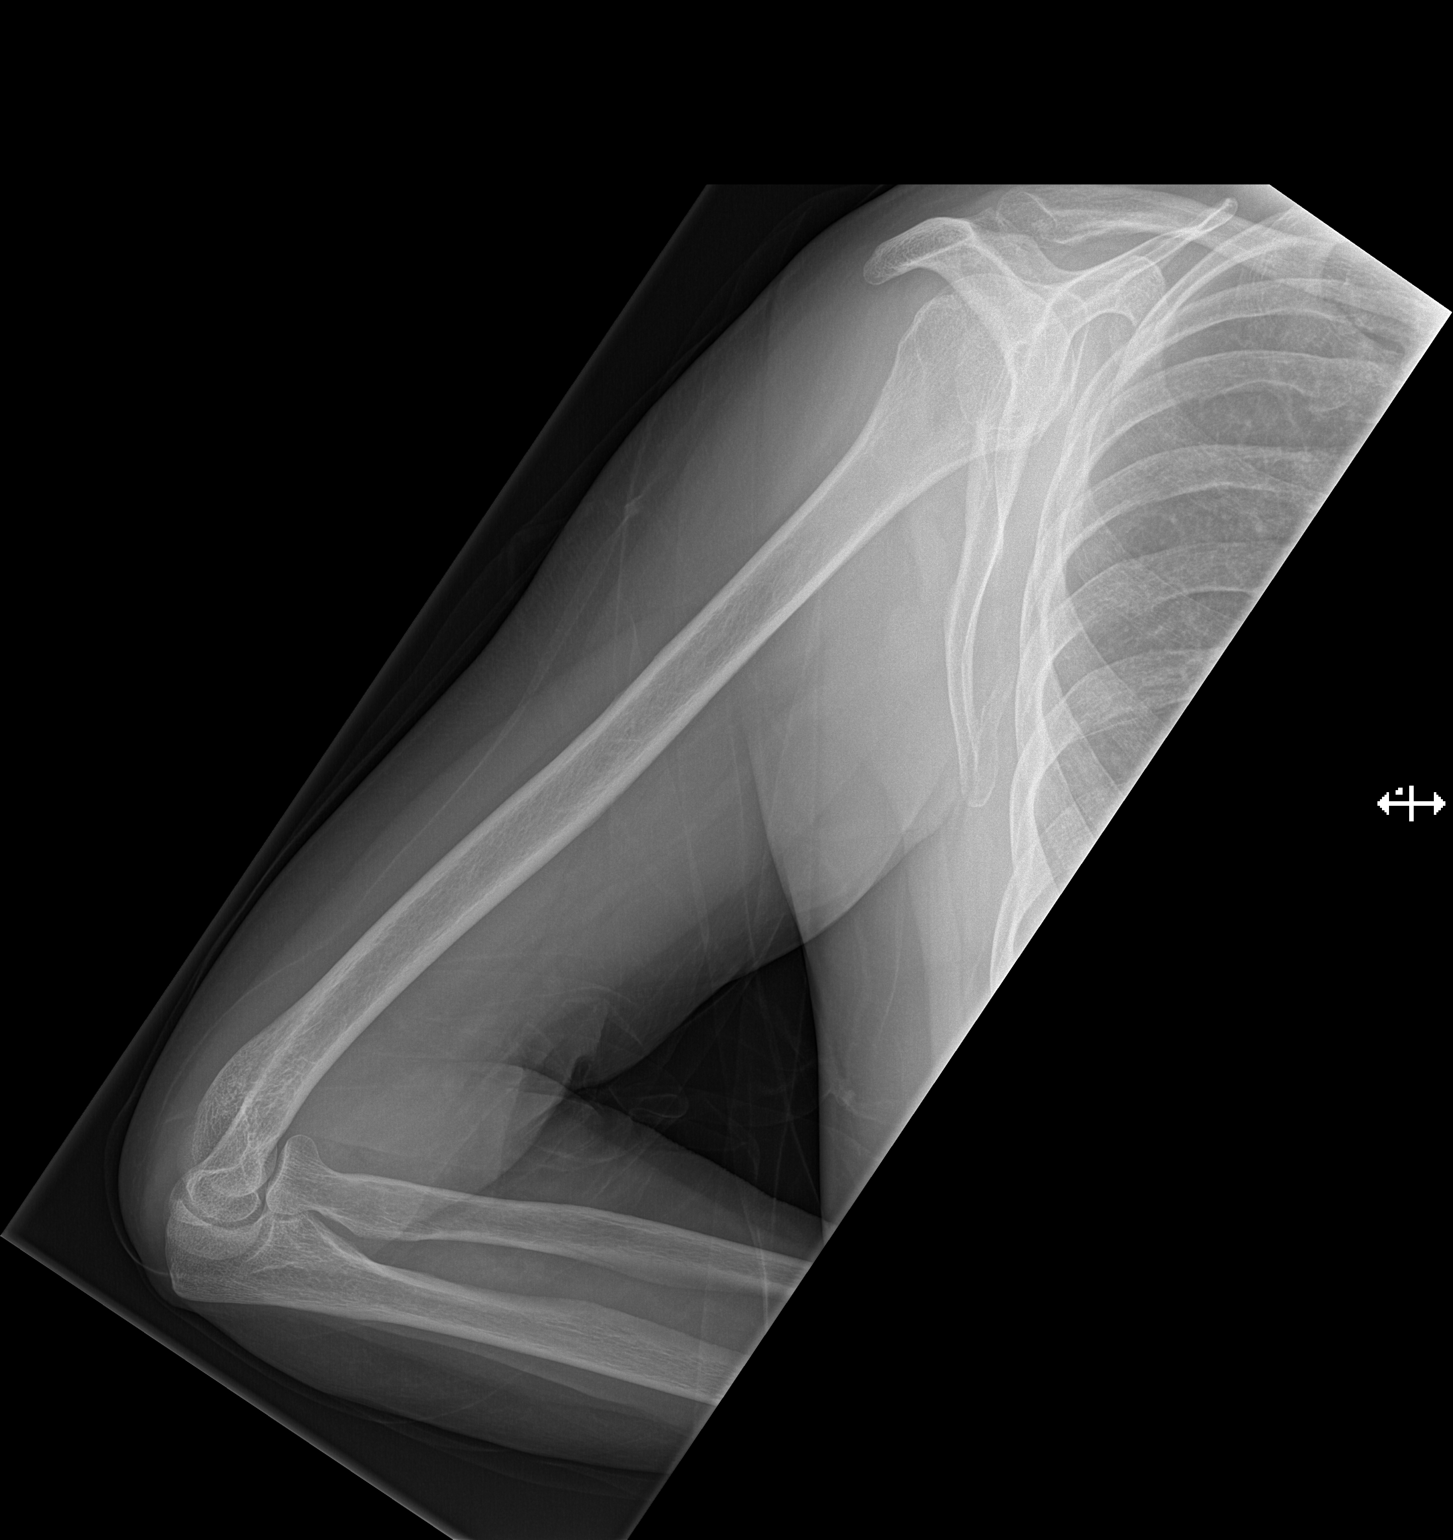

[2 of 2 positions shown; findings below may reference images not displayed]

FINDINGS: There is no evidence of fracture or other focal bone lesions. Soft
tissues are unremarkable.
IMPRESSION: Negative.

## 2019-07-02 DIAGNOSIS — L249 Irritant contact dermatitis, unspecified cause: Secondary | ICD-10-CM | POA: Diagnosis not present

## 2019-07-03 DIAGNOSIS — M5136 Other intervertebral disc degeneration, lumbar region: Secondary | ICD-10-CM | POA: Diagnosis not present

## 2019-07-03 DIAGNOSIS — M9905 Segmental and somatic dysfunction of pelvic region: Secondary | ICD-10-CM | POA: Diagnosis not present

## 2019-07-03 DIAGNOSIS — M955 Acquired deformity of pelvis: Secondary | ICD-10-CM | POA: Diagnosis not present

## 2019-07-03 DIAGNOSIS — M9903 Segmental and somatic dysfunction of lumbar region: Secondary | ICD-10-CM | POA: Diagnosis not present

## 2019-08-01 DIAGNOSIS — M9903 Segmental and somatic dysfunction of lumbar region: Secondary | ICD-10-CM | POA: Diagnosis not present

## 2019-08-01 DIAGNOSIS — M9905 Segmental and somatic dysfunction of pelvic region: Secondary | ICD-10-CM | POA: Diagnosis not present

## 2019-08-01 DIAGNOSIS — M955 Acquired deformity of pelvis: Secondary | ICD-10-CM | POA: Diagnosis not present

## 2019-08-01 DIAGNOSIS — M5136 Other intervertebral disc degeneration, lumbar region: Secondary | ICD-10-CM | POA: Diagnosis not present

## 2019-08-03 DIAGNOSIS — L03116 Cellulitis of left lower limb: Secondary | ICD-10-CM | POA: Diagnosis not present

## 2019-08-03 DIAGNOSIS — L308 Other specified dermatitis: Secondary | ICD-10-CM | POA: Diagnosis not present

## 2019-08-03 DIAGNOSIS — K219 Gastro-esophageal reflux disease without esophagitis: Secondary | ICD-10-CM | POA: Diagnosis not present

## 2019-08-03 DIAGNOSIS — N390 Urinary tract infection, site not specified: Secondary | ICD-10-CM | POA: Diagnosis not present

## 2019-08-03 DIAGNOSIS — E7800 Pure hypercholesterolemia, unspecified: Secondary | ICD-10-CM | POA: Diagnosis not present

## 2019-08-03 DIAGNOSIS — I10 Essential (primary) hypertension: Secondary | ICD-10-CM | POA: Diagnosis not present

## 2019-08-03 DIAGNOSIS — N183 Chronic kidney disease, stage 3 (moderate): Secondary | ICD-10-CM | POA: Diagnosis not present

## 2019-08-10 DIAGNOSIS — L03116 Cellulitis of left lower limb: Secondary | ICD-10-CM | POA: Diagnosis not present

## 2019-08-10 DIAGNOSIS — R21 Rash and other nonspecific skin eruption: Secondary | ICD-10-CM | POA: Diagnosis not present

## 2019-08-10 DIAGNOSIS — R609 Edema, unspecified: Secondary | ICD-10-CM | POA: Diagnosis not present

## 2019-08-28 IMAGING — DX DG SHOULDER 2+V PORT*R*
1 series · 2 of 2 positions shown · non-contrast
Comparison: 01/27/2018

CLINICAL DATA: 83-year-old female with a history of right shoulder
replacement

EXAM:
PORTABLE RIGHT SHOULDER

[Series 1: shoulder · 0.14mm/px · 2 of 2 slices shown]
[im 1/2]
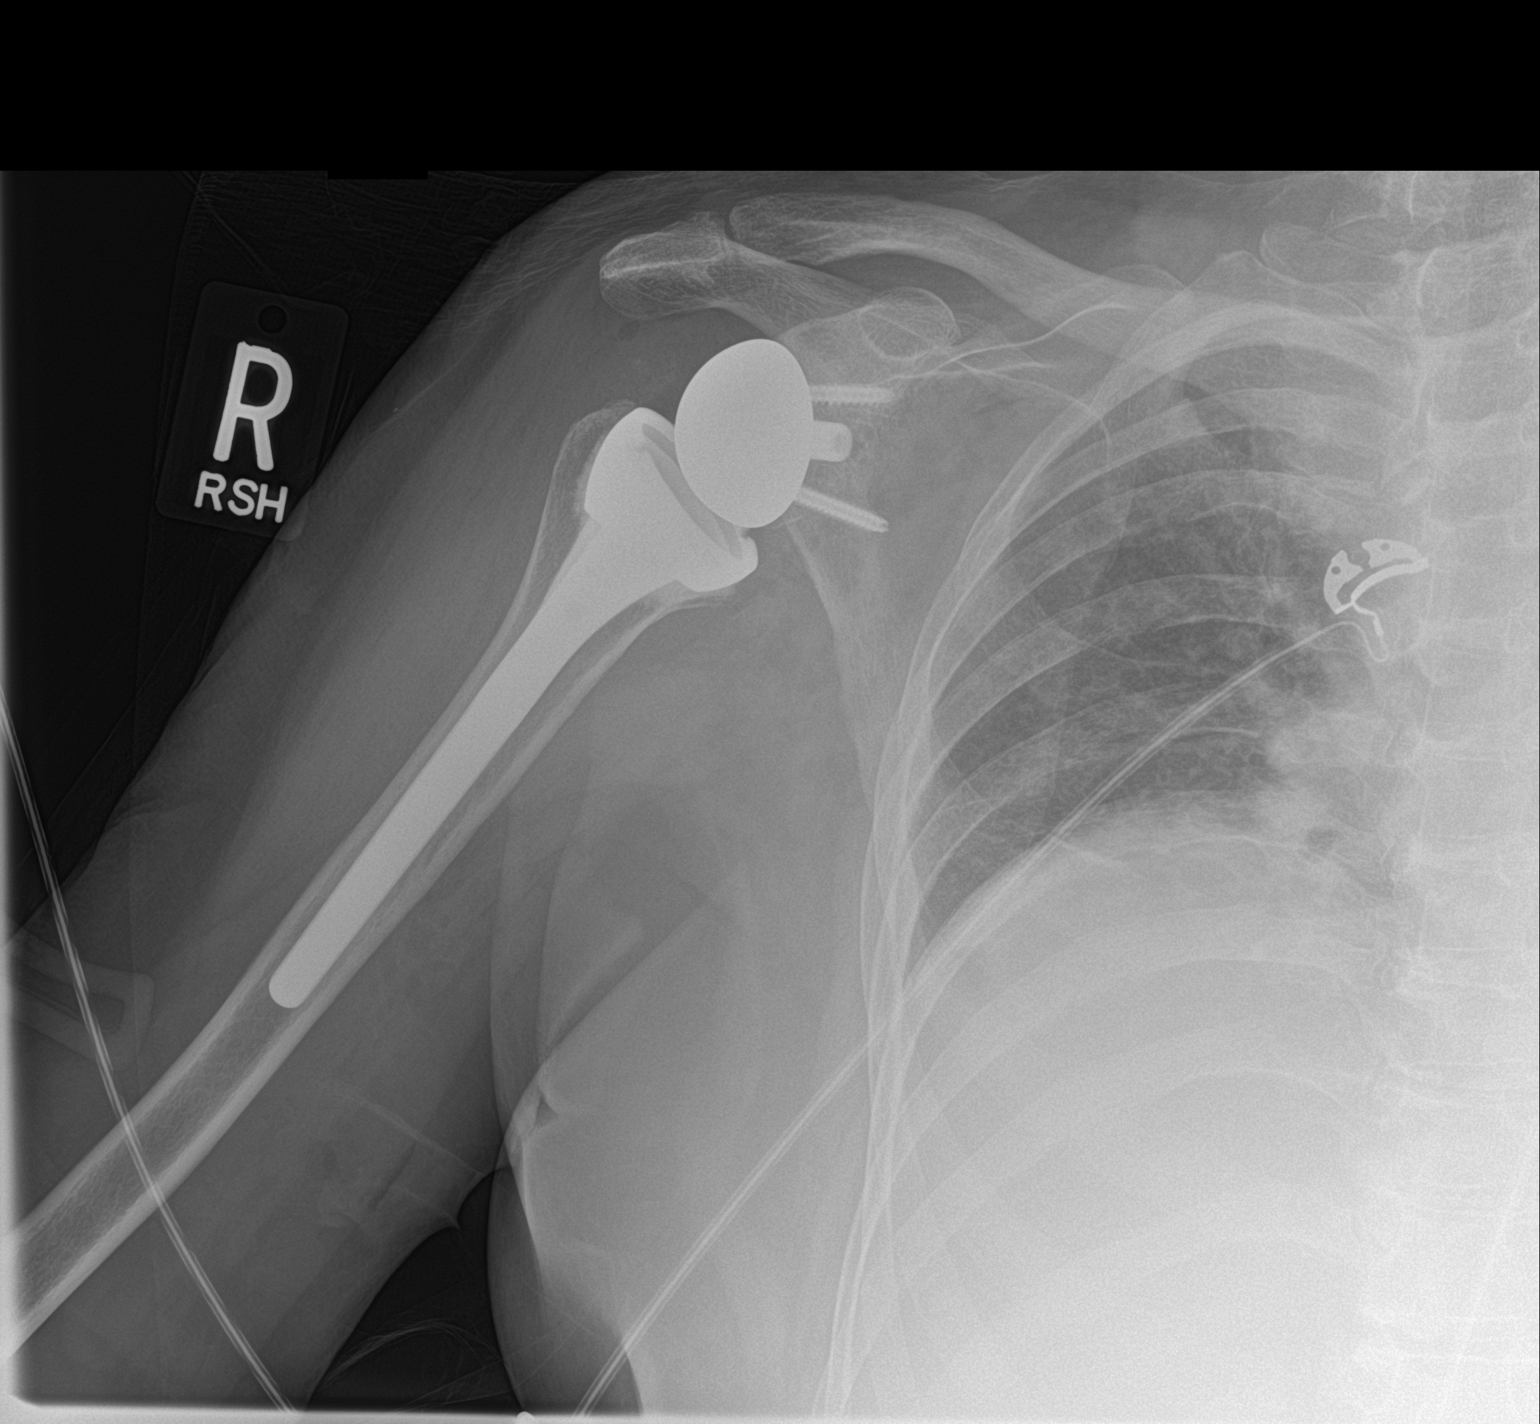
[im 2/2]
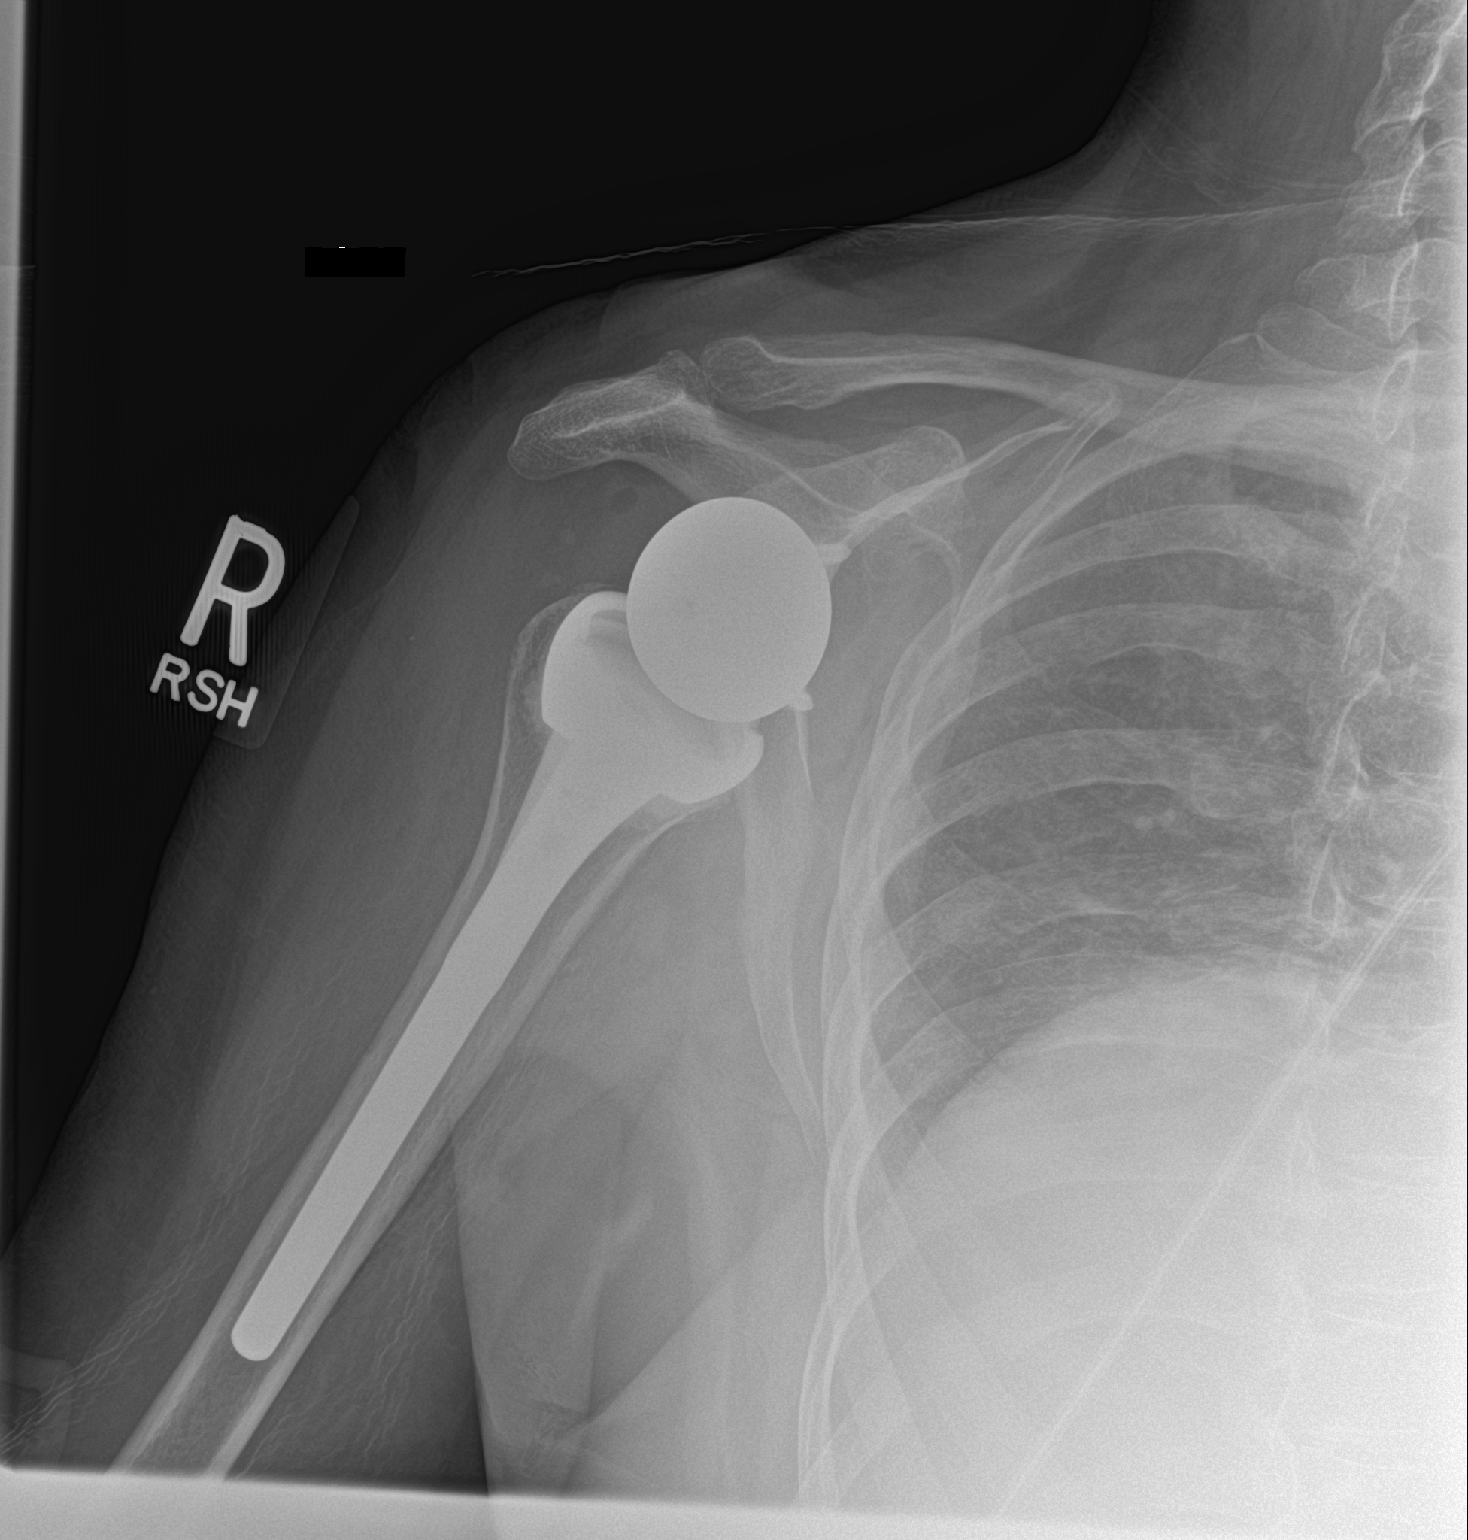

[2 of 2 positions shown; findings below may reference images not displayed]

FINDINGS: Surgical changes of right shoulder arthroplasty. Components appear
congruent. No fracture identified. Soft tissue swelling at the
surgical site.
IMPRESSION: Early surgical changes of right shoulder arthroplasty, without
complicating features.

## 2019-08-29 DIAGNOSIS — M5136 Other intervertebral disc degeneration, lumbar region: Secondary | ICD-10-CM | POA: Diagnosis not present

## 2019-08-29 DIAGNOSIS — M9905 Segmental and somatic dysfunction of pelvic region: Secondary | ICD-10-CM | POA: Diagnosis not present

## 2019-08-29 DIAGNOSIS — M955 Acquired deformity of pelvis: Secondary | ICD-10-CM | POA: Diagnosis not present

## 2019-08-29 DIAGNOSIS — M9903 Segmental and somatic dysfunction of lumbar region: Secondary | ICD-10-CM | POA: Diagnosis not present

## 2019-09-14 DIAGNOSIS — R600 Localized edema: Secondary | ICD-10-CM | POA: Diagnosis not present

## 2019-09-14 DIAGNOSIS — I872 Venous insufficiency (chronic) (peripheral): Secondary | ICD-10-CM | POA: Diagnosis not present

## 2019-09-26 DIAGNOSIS — M9905 Segmental and somatic dysfunction of pelvic region: Secondary | ICD-10-CM | POA: Diagnosis not present

## 2019-09-26 DIAGNOSIS — M5136 Other intervertebral disc degeneration, lumbar region: Secondary | ICD-10-CM | POA: Diagnosis not present

## 2019-09-26 DIAGNOSIS — M9903 Segmental and somatic dysfunction of lumbar region: Secondary | ICD-10-CM | POA: Diagnosis not present

## 2019-09-26 DIAGNOSIS — M955 Acquired deformity of pelvis: Secondary | ICD-10-CM | POA: Diagnosis not present

## 2019-10-24 DIAGNOSIS — M5136 Other intervertebral disc degeneration, lumbar region: Secondary | ICD-10-CM | POA: Diagnosis not present

## 2019-10-24 DIAGNOSIS — M9903 Segmental and somatic dysfunction of lumbar region: Secondary | ICD-10-CM | POA: Diagnosis not present

## 2019-10-24 DIAGNOSIS — M9905 Segmental and somatic dysfunction of pelvic region: Secondary | ICD-10-CM | POA: Diagnosis not present

## 2019-10-24 DIAGNOSIS — M955 Acquired deformity of pelvis: Secondary | ICD-10-CM | POA: Diagnosis not present

## 2019-11-20 DIAGNOSIS — M9903 Segmental and somatic dysfunction of lumbar region: Secondary | ICD-10-CM | POA: Diagnosis not present

## 2019-11-20 DIAGNOSIS — M9905 Segmental and somatic dysfunction of pelvic region: Secondary | ICD-10-CM | POA: Diagnosis not present

## 2019-11-20 DIAGNOSIS — M955 Acquired deformity of pelvis: Secondary | ICD-10-CM | POA: Diagnosis not present

## 2019-11-20 DIAGNOSIS — M5136 Other intervertebral disc degeneration, lumbar region: Secondary | ICD-10-CM | POA: Diagnosis not present

## 2019-11-27 DIAGNOSIS — M9905 Segmental and somatic dysfunction of pelvic region: Secondary | ICD-10-CM | POA: Diagnosis not present

## 2019-11-27 DIAGNOSIS — M5136 Other intervertebral disc degeneration, lumbar region: Secondary | ICD-10-CM | POA: Diagnosis not present

## 2019-11-27 DIAGNOSIS — M955 Acquired deformity of pelvis: Secondary | ICD-10-CM | POA: Diagnosis not present

## 2019-11-27 DIAGNOSIS — M9903 Segmental and somatic dysfunction of lumbar region: Secondary | ICD-10-CM | POA: Diagnosis not present

## 2019-12-13 DIAGNOSIS — I10 Essential (primary) hypertension: Secondary | ICD-10-CM | POA: Diagnosis not present

## 2019-12-18 DIAGNOSIS — M9903 Segmental and somatic dysfunction of lumbar region: Secondary | ICD-10-CM | POA: Diagnosis not present

## 2019-12-18 DIAGNOSIS — M9905 Segmental and somatic dysfunction of pelvic region: Secondary | ICD-10-CM | POA: Diagnosis not present

## 2019-12-18 DIAGNOSIS — M5136 Other intervertebral disc degeneration, lumbar region: Secondary | ICD-10-CM | POA: Diagnosis not present

## 2019-12-18 DIAGNOSIS — M955 Acquired deformity of pelvis: Secondary | ICD-10-CM | POA: Diagnosis not present

## 2020-01-03 DIAGNOSIS — I10 Essential (primary) hypertension: Secondary | ICD-10-CM | POA: Diagnosis not present

## 2020-01-04 DIAGNOSIS — E7800 Pure hypercholesterolemia, unspecified: Secondary | ICD-10-CM | POA: Diagnosis not present

## 2020-01-04 DIAGNOSIS — I10 Essential (primary) hypertension: Secondary | ICD-10-CM | POA: Diagnosis not present

## 2020-01-04 DIAGNOSIS — N183 Chronic kidney disease, stage 3 unspecified: Secondary | ICD-10-CM | POA: Diagnosis not present

## 2020-01-08 DIAGNOSIS — M9905 Segmental and somatic dysfunction of pelvic region: Secondary | ICD-10-CM | POA: Diagnosis not present

## 2020-01-08 DIAGNOSIS — M9903 Segmental and somatic dysfunction of lumbar region: Secondary | ICD-10-CM | POA: Diagnosis not present

## 2020-01-08 DIAGNOSIS — M955 Acquired deformity of pelvis: Secondary | ICD-10-CM | POA: Diagnosis not present

## 2020-01-08 DIAGNOSIS — M5136 Other intervertebral disc degeneration, lumbar region: Secondary | ICD-10-CM | POA: Diagnosis not present

## 2020-01-10 DIAGNOSIS — I10 Essential (primary) hypertension: Secondary | ICD-10-CM | POA: Diagnosis not present

## 2020-01-10 DIAGNOSIS — N183 Chronic kidney disease, stage 3 unspecified: Secondary | ICD-10-CM | POA: Diagnosis not present

## 2020-01-10 DIAGNOSIS — E7800 Pure hypercholesterolemia, unspecified: Secondary | ICD-10-CM | POA: Diagnosis not present

## 2020-01-29 DIAGNOSIS — M9905 Segmental and somatic dysfunction of pelvic region: Secondary | ICD-10-CM | POA: Diagnosis not present

## 2020-01-29 DIAGNOSIS — M955 Acquired deformity of pelvis: Secondary | ICD-10-CM | POA: Diagnosis not present

## 2020-01-29 DIAGNOSIS — M9903 Segmental and somatic dysfunction of lumbar region: Secondary | ICD-10-CM | POA: Diagnosis not present

## 2020-01-29 DIAGNOSIS — M5136 Other intervertebral disc degeneration, lumbar region: Secondary | ICD-10-CM | POA: Diagnosis not present

## 2020-02-01 DIAGNOSIS — I10 Essential (primary) hypertension: Secondary | ICD-10-CM | POA: Diagnosis not present

## 2020-02-07 DIAGNOSIS — E7800 Pure hypercholesterolemia, unspecified: Secondary | ICD-10-CM | POA: Diagnosis not present

## 2020-02-07 DIAGNOSIS — R600 Localized edema: Secondary | ICD-10-CM | POA: Diagnosis not present

## 2020-02-07 DIAGNOSIS — K219 Gastro-esophageal reflux disease without esophagitis: Secondary | ICD-10-CM | POA: Diagnosis not present

## 2020-02-07 DIAGNOSIS — L309 Dermatitis, unspecified: Secondary | ICD-10-CM | POA: Diagnosis not present

## 2020-02-07 DIAGNOSIS — N1831 Chronic kidney disease, stage 3a: Secondary | ICD-10-CM | POA: Diagnosis not present

## 2020-02-07 DIAGNOSIS — I10 Essential (primary) hypertension: Secondary | ICD-10-CM | POA: Diagnosis not present

## 2020-02-12 DIAGNOSIS — M955 Acquired deformity of pelvis: Secondary | ICD-10-CM | POA: Diagnosis not present

## 2020-02-12 DIAGNOSIS — M5136 Other intervertebral disc degeneration, lumbar region: Secondary | ICD-10-CM | POA: Diagnosis not present

## 2020-02-12 DIAGNOSIS — M9903 Segmental and somatic dysfunction of lumbar region: Secondary | ICD-10-CM | POA: Diagnosis not present

## 2020-02-12 DIAGNOSIS — M9905 Segmental and somatic dysfunction of pelvic region: Secondary | ICD-10-CM | POA: Diagnosis not present

## 2020-02-14 DIAGNOSIS — E7800 Pure hypercholesterolemia, unspecified: Secondary | ICD-10-CM | POA: Diagnosis not present

## 2020-02-14 DIAGNOSIS — I10 Essential (primary) hypertension: Secondary | ICD-10-CM | POA: Diagnosis not present

## 2020-03-04 DIAGNOSIS — M5136 Other intervertebral disc degeneration, lumbar region: Secondary | ICD-10-CM | POA: Diagnosis not present

## 2020-03-04 DIAGNOSIS — M9903 Segmental and somatic dysfunction of lumbar region: Secondary | ICD-10-CM | POA: Diagnosis not present

## 2020-03-04 DIAGNOSIS — I10 Essential (primary) hypertension: Secondary | ICD-10-CM | POA: Diagnosis not present

## 2020-03-04 DIAGNOSIS — M9905 Segmental and somatic dysfunction of pelvic region: Secondary | ICD-10-CM | POA: Diagnosis not present

## 2020-03-04 DIAGNOSIS — M955 Acquired deformity of pelvis: Secondary | ICD-10-CM | POA: Diagnosis not present

## 2020-03-24 DIAGNOSIS — N183 Chronic kidney disease, stage 3 unspecified: Secondary | ICD-10-CM | POA: Diagnosis not present

## 2020-03-24 DIAGNOSIS — E7800 Pure hypercholesterolemia, unspecified: Secondary | ICD-10-CM | POA: Diagnosis not present

## 2020-03-24 DIAGNOSIS — I10 Essential (primary) hypertension: Secondary | ICD-10-CM | POA: Diagnosis not present

## 2020-03-25 DIAGNOSIS — M9905 Segmental and somatic dysfunction of pelvic region: Secondary | ICD-10-CM | POA: Diagnosis not present

## 2020-03-25 DIAGNOSIS — M9903 Segmental and somatic dysfunction of lumbar region: Secondary | ICD-10-CM | POA: Diagnosis not present

## 2020-03-25 DIAGNOSIS — M955 Acquired deformity of pelvis: Secondary | ICD-10-CM | POA: Diagnosis not present

## 2020-03-25 DIAGNOSIS — M5136 Other intervertebral disc degeneration, lumbar region: Secondary | ICD-10-CM | POA: Diagnosis not present

## 2020-04-02 DIAGNOSIS — M955 Acquired deformity of pelvis: Secondary | ICD-10-CM | POA: Diagnosis not present

## 2020-04-02 DIAGNOSIS — M9905 Segmental and somatic dysfunction of pelvic region: Secondary | ICD-10-CM | POA: Diagnosis not present

## 2020-04-02 DIAGNOSIS — M9903 Segmental and somatic dysfunction of lumbar region: Secondary | ICD-10-CM | POA: Diagnosis not present

## 2020-04-02 DIAGNOSIS — M5136 Other intervertebral disc degeneration, lumbar region: Secondary | ICD-10-CM | POA: Diagnosis not present

## 2020-04-03 DIAGNOSIS — I10 Essential (primary) hypertension: Secondary | ICD-10-CM | POA: Diagnosis not present

## 2020-04-07 ENCOUNTER — Telehealth: Payer: Self-pay | Admitting: Family Medicine

## 2020-04-07 NOTE — Telephone Encounter (Signed)
Patient states she keeps getting calls from [facility] about her getting her COVID 19 shot.  She stated she already had it done.

## 2020-04-15 DIAGNOSIS — M5136 Other intervertebral disc degeneration, lumbar region: Secondary | ICD-10-CM | POA: Diagnosis not present

## 2020-04-15 DIAGNOSIS — M955 Acquired deformity of pelvis: Secondary | ICD-10-CM | POA: Diagnosis not present

## 2020-04-15 DIAGNOSIS — M9905 Segmental and somatic dysfunction of pelvic region: Secondary | ICD-10-CM | POA: Diagnosis not present

## 2020-04-15 DIAGNOSIS — M9903 Segmental and somatic dysfunction of lumbar region: Secondary | ICD-10-CM | POA: Diagnosis not present

## 2020-04-18 DIAGNOSIS — I10 Essential (primary) hypertension: Secondary | ICD-10-CM | POA: Diagnosis not present

## 2020-04-18 DIAGNOSIS — E7800 Pure hypercholesterolemia, unspecified: Secondary | ICD-10-CM | POA: Diagnosis not present

## 2020-04-18 DIAGNOSIS — N183 Chronic kidney disease, stage 3 unspecified: Secondary | ICD-10-CM | POA: Diagnosis not present

## 2020-05-01 DIAGNOSIS — I10 Essential (primary) hypertension: Secondary | ICD-10-CM | POA: Diagnosis not present

## 2020-05-06 DIAGNOSIS — M9905 Segmental and somatic dysfunction of pelvic region: Secondary | ICD-10-CM | POA: Diagnosis not present

## 2020-05-06 DIAGNOSIS — M9903 Segmental and somatic dysfunction of lumbar region: Secondary | ICD-10-CM | POA: Diagnosis not present

## 2020-05-06 DIAGNOSIS — M955 Acquired deformity of pelvis: Secondary | ICD-10-CM | POA: Diagnosis not present

## 2020-05-06 DIAGNOSIS — M5136 Other intervertebral disc degeneration, lumbar region: Secondary | ICD-10-CM | POA: Diagnosis not present

## 2020-05-14 DIAGNOSIS — I10 Essential (primary) hypertension: Secondary | ICD-10-CM | POA: Diagnosis not present

## 2020-05-14 DIAGNOSIS — E7800 Pure hypercholesterolemia, unspecified: Secondary | ICD-10-CM | POA: Diagnosis not present

## 2020-05-14 DIAGNOSIS — N183 Chronic kidney disease, stage 3 unspecified: Secondary | ICD-10-CM | POA: Diagnosis not present

## 2020-05-27 DIAGNOSIS — M955 Acquired deformity of pelvis: Secondary | ICD-10-CM | POA: Diagnosis not present

## 2020-05-27 DIAGNOSIS — M9903 Segmental and somatic dysfunction of lumbar region: Secondary | ICD-10-CM | POA: Diagnosis not present

## 2020-05-27 DIAGNOSIS — M9905 Segmental and somatic dysfunction of pelvic region: Secondary | ICD-10-CM | POA: Diagnosis not present

## 2020-05-27 DIAGNOSIS — M5136 Other intervertebral disc degeneration, lumbar region: Secondary | ICD-10-CM | POA: Diagnosis not present

## 2020-06-11 DIAGNOSIS — D225 Melanocytic nevi of trunk: Secondary | ICD-10-CM | POA: Diagnosis not present

## 2020-06-11 DIAGNOSIS — M955 Acquired deformity of pelvis: Secondary | ICD-10-CM | POA: Diagnosis not present

## 2020-06-11 DIAGNOSIS — M9905 Segmental and somatic dysfunction of pelvic region: Secondary | ICD-10-CM | POA: Diagnosis not present

## 2020-06-11 DIAGNOSIS — L280 Lichen simplex chronicus: Secondary | ICD-10-CM | POA: Diagnosis not present

## 2020-06-11 DIAGNOSIS — M5136 Other intervertebral disc degeneration, lumbar region: Secondary | ICD-10-CM | POA: Diagnosis not present

## 2020-06-11 DIAGNOSIS — M9903 Segmental and somatic dysfunction of lumbar region: Secondary | ICD-10-CM | POA: Diagnosis not present

## 2020-06-11 DIAGNOSIS — L249 Irritant contact dermatitis, unspecified cause: Secondary | ICD-10-CM | POA: Diagnosis not present

## 2020-06-12 DIAGNOSIS — H524 Presbyopia: Secondary | ICD-10-CM | POA: Diagnosis not present

## 2020-06-12 DIAGNOSIS — H2513 Age-related nuclear cataract, bilateral: Secondary | ICD-10-CM | POA: Diagnosis not present

## 2020-06-16 DIAGNOSIS — E7800 Pure hypercholesterolemia, unspecified: Secondary | ICD-10-CM | POA: Diagnosis not present

## 2020-06-16 DIAGNOSIS — N183 Chronic kidney disease, stage 3 unspecified: Secondary | ICD-10-CM | POA: Diagnosis not present

## 2020-06-16 DIAGNOSIS — I10 Essential (primary) hypertension: Secondary | ICD-10-CM | POA: Diagnosis not present

## 2020-06-25 DIAGNOSIS — M5136 Other intervertebral disc degeneration, lumbar region: Secondary | ICD-10-CM | POA: Diagnosis not present

## 2020-06-25 DIAGNOSIS — M9903 Segmental and somatic dysfunction of lumbar region: Secondary | ICD-10-CM | POA: Diagnosis not present

## 2020-06-25 DIAGNOSIS — M9905 Segmental and somatic dysfunction of pelvic region: Secondary | ICD-10-CM | POA: Diagnosis not present

## 2020-06-25 DIAGNOSIS — M955 Acquired deformity of pelvis: Secondary | ICD-10-CM | POA: Diagnosis not present

## 2020-07-04 DIAGNOSIS — I10 Essential (primary) hypertension: Secondary | ICD-10-CM | POA: Diagnosis not present

## 2020-07-16 DIAGNOSIS — M9903 Segmental and somatic dysfunction of lumbar region: Secondary | ICD-10-CM | POA: Diagnosis not present

## 2020-07-16 DIAGNOSIS — M9905 Segmental and somatic dysfunction of pelvic region: Secondary | ICD-10-CM | POA: Diagnosis not present

## 2020-07-16 DIAGNOSIS — M5136 Other intervertebral disc degeneration, lumbar region: Secondary | ICD-10-CM | POA: Diagnosis not present

## 2020-07-16 DIAGNOSIS — M955 Acquired deformity of pelvis: Secondary | ICD-10-CM | POA: Diagnosis not present

## 2020-08-05 DIAGNOSIS — E7800 Pure hypercholesterolemia, unspecified: Secondary | ICD-10-CM | POA: Diagnosis not present

## 2020-08-05 DIAGNOSIS — I10 Essential (primary) hypertension: Secondary | ICD-10-CM | POA: Diagnosis not present

## 2020-08-05 DIAGNOSIS — N183 Chronic kidney disease, stage 3 unspecified: Secondary | ICD-10-CM | POA: Diagnosis not present

## 2020-08-06 DIAGNOSIS — M9903 Segmental and somatic dysfunction of lumbar region: Secondary | ICD-10-CM | POA: Diagnosis not present

## 2020-08-06 DIAGNOSIS — M5136 Other intervertebral disc degeneration, lumbar region: Secondary | ICD-10-CM | POA: Diagnosis not present

## 2020-08-06 DIAGNOSIS — M9905 Segmental and somatic dysfunction of pelvic region: Secondary | ICD-10-CM | POA: Diagnosis not present

## 2020-08-06 DIAGNOSIS — M955 Acquired deformity of pelvis: Secondary | ICD-10-CM | POA: Diagnosis not present

## 2020-08-12 DIAGNOSIS — J309 Allergic rhinitis, unspecified: Secondary | ICD-10-CM | POA: Diagnosis not present

## 2020-08-12 DIAGNOSIS — I10 Essential (primary) hypertension: Secondary | ICD-10-CM | POA: Diagnosis not present

## 2020-08-12 DIAGNOSIS — K219 Gastro-esophageal reflux disease without esophagitis: Secondary | ICD-10-CM | POA: Diagnosis not present

## 2020-08-12 DIAGNOSIS — E7800 Pure hypercholesterolemia, unspecified: Secondary | ICD-10-CM | POA: Diagnosis not present

## 2020-08-12 DIAGNOSIS — N1831 Chronic kidney disease, stage 3a: Secondary | ICD-10-CM | POA: Diagnosis not present

## 2020-08-12 DIAGNOSIS — Z0000 Encounter for general adult medical examination without abnormal findings: Secondary | ICD-10-CM | POA: Diagnosis not present

## 2020-08-12 DIAGNOSIS — N390 Urinary tract infection, site not specified: Secondary | ICD-10-CM | POA: Diagnosis not present

## 2020-08-27 DIAGNOSIS — M9903 Segmental and somatic dysfunction of lumbar region: Secondary | ICD-10-CM | POA: Diagnosis not present

## 2020-08-27 DIAGNOSIS — M9905 Segmental and somatic dysfunction of pelvic region: Secondary | ICD-10-CM | POA: Diagnosis not present

## 2020-08-27 DIAGNOSIS — M5136 Other intervertebral disc degeneration, lumbar region: Secondary | ICD-10-CM | POA: Diagnosis not present

## 2020-08-27 DIAGNOSIS — M955 Acquired deformity of pelvis: Secondary | ICD-10-CM | POA: Diagnosis not present

## 2020-09-04 DIAGNOSIS — E7800 Pure hypercholesterolemia, unspecified: Secondary | ICD-10-CM | POA: Diagnosis not present

## 2020-09-04 DIAGNOSIS — I10 Essential (primary) hypertension: Secondary | ICD-10-CM | POA: Diagnosis not present

## 2020-09-04 DIAGNOSIS — N183 Chronic kidney disease, stage 3 unspecified: Secondary | ICD-10-CM | POA: Diagnosis not present

## 2020-09-17 DIAGNOSIS — M5136 Other intervertebral disc degeneration, lumbar region: Secondary | ICD-10-CM | POA: Diagnosis not present

## 2020-09-17 DIAGNOSIS — M9905 Segmental and somatic dysfunction of pelvic region: Secondary | ICD-10-CM | POA: Diagnosis not present

## 2020-09-17 DIAGNOSIS — M9903 Segmental and somatic dysfunction of lumbar region: Secondary | ICD-10-CM | POA: Diagnosis not present

## 2020-09-17 DIAGNOSIS — M955 Acquired deformity of pelvis: Secondary | ICD-10-CM | POA: Diagnosis not present

## 2020-09-29 DIAGNOSIS — I10 Essential (primary) hypertension: Secondary | ICD-10-CM | POA: Diagnosis not present

## 2020-09-29 DIAGNOSIS — E7800 Pure hypercholesterolemia, unspecified: Secondary | ICD-10-CM | POA: Diagnosis not present

## 2020-09-29 DIAGNOSIS — N183 Chronic kidney disease, stage 3 unspecified: Secondary | ICD-10-CM | POA: Diagnosis not present

## 2020-10-01 DIAGNOSIS — I10 Essential (primary) hypertension: Secondary | ICD-10-CM | POA: Diagnosis not present

## 2020-10-03 DIAGNOSIS — I10 Essential (primary) hypertension: Secondary | ICD-10-CM | POA: Diagnosis not present

## 2020-10-08 DIAGNOSIS — M955 Acquired deformity of pelvis: Secondary | ICD-10-CM | POA: Diagnosis not present

## 2020-10-08 DIAGNOSIS — M9905 Segmental and somatic dysfunction of pelvic region: Secondary | ICD-10-CM | POA: Diagnosis not present

## 2020-10-08 DIAGNOSIS — M9903 Segmental and somatic dysfunction of lumbar region: Secondary | ICD-10-CM | POA: Diagnosis not present

## 2020-10-08 DIAGNOSIS — M5136 Other intervertebral disc degeneration, lumbar region: Secondary | ICD-10-CM | POA: Diagnosis not present

## 2020-10-22 DIAGNOSIS — I10 Essential (primary) hypertension: Secondary | ICD-10-CM | POA: Diagnosis not present

## 2020-10-28 DIAGNOSIS — M955 Acquired deformity of pelvis: Secondary | ICD-10-CM | POA: Diagnosis not present

## 2020-10-28 DIAGNOSIS — M9903 Segmental and somatic dysfunction of lumbar region: Secondary | ICD-10-CM | POA: Diagnosis not present

## 2020-10-28 DIAGNOSIS — M9905 Segmental and somatic dysfunction of pelvic region: Secondary | ICD-10-CM | POA: Diagnosis not present

## 2020-10-28 DIAGNOSIS — M5136 Other intervertebral disc degeneration, lumbar region: Secondary | ICD-10-CM | POA: Diagnosis not present

## 2020-10-31 DIAGNOSIS — I10 Essential (primary) hypertension: Secondary | ICD-10-CM | POA: Diagnosis not present

## 2020-11-02 DIAGNOSIS — I10 Essential (primary) hypertension: Secondary | ICD-10-CM | POA: Diagnosis not present

## 2020-11-03 DIAGNOSIS — E7800 Pure hypercholesterolemia, unspecified: Secondary | ICD-10-CM | POA: Diagnosis not present

## 2020-11-03 DIAGNOSIS — K219 Gastro-esophageal reflux disease without esophagitis: Secondary | ICD-10-CM | POA: Diagnosis not present

## 2020-11-03 DIAGNOSIS — I10 Essential (primary) hypertension: Secondary | ICD-10-CM | POA: Diagnosis not present

## 2020-11-03 DIAGNOSIS — G8929 Other chronic pain: Secondary | ICD-10-CM | POA: Diagnosis not present

## 2020-11-03 DIAGNOSIS — N183 Chronic kidney disease, stage 3 unspecified: Secondary | ICD-10-CM | POA: Diagnosis not present

## 2020-11-06 DIAGNOSIS — I10 Essential (primary) hypertension: Secondary | ICD-10-CM | POA: Diagnosis not present

## 2020-11-06 DIAGNOSIS — N183 Chronic kidney disease, stage 3 unspecified: Secondary | ICD-10-CM | POA: Diagnosis not present

## 2020-11-06 DIAGNOSIS — G8929 Other chronic pain: Secondary | ICD-10-CM | POA: Diagnosis not present

## 2020-11-06 DIAGNOSIS — E7800 Pure hypercholesterolemia, unspecified: Secondary | ICD-10-CM | POA: Diagnosis not present

## 2020-11-06 DIAGNOSIS — K219 Gastro-esophageal reflux disease without esophagitis: Secondary | ICD-10-CM | POA: Diagnosis not present

## 2020-11-19 DIAGNOSIS — M955 Acquired deformity of pelvis: Secondary | ICD-10-CM | POA: Diagnosis not present

## 2020-11-19 DIAGNOSIS — M9903 Segmental and somatic dysfunction of lumbar region: Secondary | ICD-10-CM | POA: Diagnosis not present

## 2020-11-19 DIAGNOSIS — M9905 Segmental and somatic dysfunction of pelvic region: Secondary | ICD-10-CM | POA: Diagnosis not present

## 2020-11-19 DIAGNOSIS — M5136 Other intervertebral disc degeneration, lumbar region: Secondary | ICD-10-CM | POA: Diagnosis not present

## 2020-12-04 DIAGNOSIS — I10 Essential (primary) hypertension: Secondary | ICD-10-CM | POA: Diagnosis not present

## 2020-12-05 DIAGNOSIS — I10 Essential (primary) hypertension: Secondary | ICD-10-CM | POA: Diagnosis not present

## 2020-12-10 DIAGNOSIS — N183 Chronic kidney disease, stage 3 unspecified: Secondary | ICD-10-CM | POA: Diagnosis not present

## 2020-12-10 DIAGNOSIS — M5136 Other intervertebral disc degeneration, lumbar region: Secondary | ICD-10-CM | POA: Diagnosis not present

## 2020-12-10 DIAGNOSIS — M955 Acquired deformity of pelvis: Secondary | ICD-10-CM | POA: Diagnosis not present

## 2020-12-10 DIAGNOSIS — I10 Essential (primary) hypertension: Secondary | ICD-10-CM | POA: Diagnosis not present

## 2020-12-10 DIAGNOSIS — G8929 Other chronic pain: Secondary | ICD-10-CM | POA: Diagnosis not present

## 2020-12-10 DIAGNOSIS — M9905 Segmental and somatic dysfunction of pelvic region: Secondary | ICD-10-CM | POA: Diagnosis not present

## 2020-12-10 DIAGNOSIS — M9903 Segmental and somatic dysfunction of lumbar region: Secondary | ICD-10-CM | POA: Diagnosis not present

## 2020-12-10 DIAGNOSIS — E7800 Pure hypercholesterolemia, unspecified: Secondary | ICD-10-CM | POA: Diagnosis not present

## 2020-12-10 DIAGNOSIS — K219 Gastro-esophageal reflux disease without esophagitis: Secondary | ICD-10-CM | POA: Diagnosis not present

## 2020-12-31 DIAGNOSIS — M5136 Other intervertebral disc degeneration, lumbar region: Secondary | ICD-10-CM | POA: Diagnosis not present

## 2020-12-31 DIAGNOSIS — M9905 Segmental and somatic dysfunction of pelvic region: Secondary | ICD-10-CM | POA: Diagnosis not present

## 2020-12-31 DIAGNOSIS — M955 Acquired deformity of pelvis: Secondary | ICD-10-CM | POA: Diagnosis not present

## 2020-12-31 DIAGNOSIS — M9903 Segmental and somatic dysfunction of lumbar region: Secondary | ICD-10-CM | POA: Diagnosis not present

## 2021-01-04 DIAGNOSIS — I10 Essential (primary) hypertension: Secondary | ICD-10-CM | POA: Diagnosis not present

## 2021-01-05 DIAGNOSIS — I10 Essential (primary) hypertension: Secondary | ICD-10-CM | POA: Diagnosis not present

## 2021-01-21 DIAGNOSIS — M5136 Other intervertebral disc degeneration, lumbar region: Secondary | ICD-10-CM | POA: Diagnosis not present

## 2021-01-21 DIAGNOSIS — M9905 Segmental and somatic dysfunction of pelvic region: Secondary | ICD-10-CM | POA: Diagnosis not present

## 2021-01-21 DIAGNOSIS — M9903 Segmental and somatic dysfunction of lumbar region: Secondary | ICD-10-CM | POA: Diagnosis not present

## 2021-01-21 DIAGNOSIS — M955 Acquired deformity of pelvis: Secondary | ICD-10-CM | POA: Diagnosis not present

## 2021-02-11 DIAGNOSIS — M9903 Segmental and somatic dysfunction of lumbar region: Secondary | ICD-10-CM | POA: Diagnosis not present

## 2021-02-11 DIAGNOSIS — M955 Acquired deformity of pelvis: Secondary | ICD-10-CM | POA: Diagnosis not present

## 2021-02-11 DIAGNOSIS — M5136 Other intervertebral disc degeneration, lumbar region: Secondary | ICD-10-CM | POA: Diagnosis not present

## 2021-02-11 DIAGNOSIS — M9905 Segmental and somatic dysfunction of pelvic region: Secondary | ICD-10-CM | POA: Diagnosis not present

## 2021-02-12 DIAGNOSIS — E7800 Pure hypercholesterolemia, unspecified: Secondary | ICD-10-CM | POA: Diagnosis not present

## 2021-02-12 DIAGNOSIS — L989 Disorder of the skin and subcutaneous tissue, unspecified: Secondary | ICD-10-CM | POA: Diagnosis not present

## 2021-02-12 DIAGNOSIS — Z0000 Encounter for general adult medical examination without abnormal findings: Secondary | ICD-10-CM | POA: Diagnosis not present

## 2021-02-12 DIAGNOSIS — I10 Essential (primary) hypertension: Secondary | ICD-10-CM | POA: Diagnosis not present

## 2021-02-12 DIAGNOSIS — N390 Urinary tract infection, site not specified: Secondary | ICD-10-CM | POA: Diagnosis not present

## 2021-02-12 DIAGNOSIS — K219 Gastro-esophageal reflux disease without esophagitis: Secondary | ICD-10-CM | POA: Diagnosis not present

## 2021-02-12 DIAGNOSIS — N183 Chronic kidney disease, stage 3 unspecified: Secondary | ICD-10-CM | POA: Diagnosis not present

## 2021-02-12 DIAGNOSIS — L308 Other specified dermatitis: Secondary | ICD-10-CM | POA: Diagnosis not present

## 2021-02-19 DIAGNOSIS — B079 Viral wart, unspecified: Secondary | ICD-10-CM | POA: Diagnosis not present

## 2021-02-19 DIAGNOSIS — R202 Paresthesia of skin: Secondary | ICD-10-CM | POA: Diagnosis not present

## 2021-02-19 DIAGNOSIS — D485 Neoplasm of uncertain behavior of skin: Secondary | ICD-10-CM | POA: Diagnosis not present

## 2021-03-04 DIAGNOSIS — N183 Chronic kidney disease, stage 3 unspecified: Secondary | ICD-10-CM | POA: Diagnosis not present

## 2021-03-04 DIAGNOSIS — K219 Gastro-esophageal reflux disease without esophagitis: Secondary | ICD-10-CM | POA: Diagnosis not present

## 2021-03-04 DIAGNOSIS — I10 Essential (primary) hypertension: Secondary | ICD-10-CM | POA: Diagnosis not present

## 2021-03-04 DIAGNOSIS — E7800 Pure hypercholesterolemia, unspecified: Secondary | ICD-10-CM | POA: Diagnosis not present

## 2021-03-04 DIAGNOSIS — G8929 Other chronic pain: Secondary | ICD-10-CM | POA: Diagnosis not present

## 2021-03-11 DIAGNOSIS — M5136 Other intervertebral disc degeneration, lumbar region: Secondary | ICD-10-CM | POA: Diagnosis not present

## 2021-03-11 DIAGNOSIS — M955 Acquired deformity of pelvis: Secondary | ICD-10-CM | POA: Diagnosis not present

## 2021-03-11 DIAGNOSIS — M9903 Segmental and somatic dysfunction of lumbar region: Secondary | ICD-10-CM | POA: Diagnosis not present

## 2021-03-11 DIAGNOSIS — M9905 Segmental and somatic dysfunction of pelvic region: Secondary | ICD-10-CM | POA: Diagnosis not present

## 2021-04-01 DIAGNOSIS — M5136 Other intervertebral disc degeneration, lumbar region: Secondary | ICD-10-CM | POA: Diagnosis not present

## 2021-04-01 DIAGNOSIS — M955 Acquired deformity of pelvis: Secondary | ICD-10-CM | POA: Diagnosis not present

## 2021-04-01 DIAGNOSIS — M9905 Segmental and somatic dysfunction of pelvic region: Secondary | ICD-10-CM | POA: Diagnosis not present

## 2021-04-01 DIAGNOSIS — M9903 Segmental and somatic dysfunction of lumbar region: Secondary | ICD-10-CM | POA: Diagnosis not present

## 2021-04-03 DIAGNOSIS — E7800 Pure hypercholesterolemia, unspecified: Secondary | ICD-10-CM | POA: Diagnosis not present

## 2021-04-03 DIAGNOSIS — G8929 Other chronic pain: Secondary | ICD-10-CM | POA: Diagnosis not present

## 2021-04-03 DIAGNOSIS — I10 Essential (primary) hypertension: Secondary | ICD-10-CM | POA: Diagnosis not present

## 2021-04-03 DIAGNOSIS — K219 Gastro-esophageal reflux disease without esophagitis: Secondary | ICD-10-CM | POA: Diagnosis not present

## 2021-04-03 DIAGNOSIS — N183 Chronic kidney disease, stage 3 unspecified: Secondary | ICD-10-CM | POA: Diagnosis not present

## 2021-04-13 DIAGNOSIS — L905 Scar conditions and fibrosis of skin: Secondary | ICD-10-CM | POA: Diagnosis not present

## 2021-04-22 DIAGNOSIS — M9905 Segmental and somatic dysfunction of pelvic region: Secondary | ICD-10-CM | POA: Diagnosis not present

## 2021-04-22 DIAGNOSIS — M9903 Segmental and somatic dysfunction of lumbar region: Secondary | ICD-10-CM | POA: Diagnosis not present

## 2021-04-22 DIAGNOSIS — M5136 Other intervertebral disc degeneration, lumbar region: Secondary | ICD-10-CM | POA: Diagnosis not present

## 2021-04-22 DIAGNOSIS — M955 Acquired deformity of pelvis: Secondary | ICD-10-CM | POA: Diagnosis not present

## 2021-05-05 DIAGNOSIS — I10 Essential (primary) hypertension: Secondary | ICD-10-CM | POA: Diagnosis not present

## 2021-05-06 DIAGNOSIS — M5136 Other intervertebral disc degeneration, lumbar region: Secondary | ICD-10-CM | POA: Diagnosis not present

## 2021-05-06 DIAGNOSIS — M955 Acquired deformity of pelvis: Secondary | ICD-10-CM | POA: Diagnosis not present

## 2021-05-06 DIAGNOSIS — M9905 Segmental and somatic dysfunction of pelvic region: Secondary | ICD-10-CM | POA: Diagnosis not present

## 2021-05-06 DIAGNOSIS — M9903 Segmental and somatic dysfunction of lumbar region: Secondary | ICD-10-CM | POA: Diagnosis not present

## 2021-05-27 DIAGNOSIS — M9905 Segmental and somatic dysfunction of pelvic region: Secondary | ICD-10-CM | POA: Diagnosis not present

## 2021-05-27 DIAGNOSIS — M955 Acquired deformity of pelvis: Secondary | ICD-10-CM | POA: Diagnosis not present

## 2021-05-27 DIAGNOSIS — M9903 Segmental and somatic dysfunction of lumbar region: Secondary | ICD-10-CM | POA: Diagnosis not present

## 2021-05-27 DIAGNOSIS — M5136 Other intervertebral disc degeneration, lumbar region: Secondary | ICD-10-CM | POA: Diagnosis not present

## 2021-06-01 DIAGNOSIS — G8929 Other chronic pain: Secondary | ICD-10-CM | POA: Diagnosis not present

## 2021-06-01 DIAGNOSIS — N183 Chronic kidney disease, stage 3 unspecified: Secondary | ICD-10-CM | POA: Diagnosis not present

## 2021-06-01 DIAGNOSIS — I10 Essential (primary) hypertension: Secondary | ICD-10-CM | POA: Diagnosis not present

## 2021-06-01 DIAGNOSIS — K219 Gastro-esophageal reflux disease without esophagitis: Secondary | ICD-10-CM | POA: Diagnosis not present

## 2021-06-01 DIAGNOSIS — E7800 Pure hypercholesterolemia, unspecified: Secondary | ICD-10-CM | POA: Diagnosis not present

## 2021-06-16 DIAGNOSIS — H5203 Hypermetropia, bilateral: Secondary | ICD-10-CM | POA: Diagnosis not present

## 2021-06-16 DIAGNOSIS — H52203 Unspecified astigmatism, bilateral: Secondary | ICD-10-CM | POA: Diagnosis not present

## 2021-06-16 DIAGNOSIS — H2513 Age-related nuclear cataract, bilateral: Secondary | ICD-10-CM | POA: Diagnosis not present

## 2021-06-16 DIAGNOSIS — H524 Presbyopia: Secondary | ICD-10-CM | POA: Diagnosis not present

## 2021-06-17 DIAGNOSIS — M5136 Other intervertebral disc degeneration, lumbar region: Secondary | ICD-10-CM | POA: Diagnosis not present

## 2021-06-17 DIAGNOSIS — M955 Acquired deformity of pelvis: Secondary | ICD-10-CM | POA: Diagnosis not present

## 2021-06-17 DIAGNOSIS — M9905 Segmental and somatic dysfunction of pelvic region: Secondary | ICD-10-CM | POA: Diagnosis not present

## 2021-06-17 DIAGNOSIS — M9903 Segmental and somatic dysfunction of lumbar region: Secondary | ICD-10-CM | POA: Diagnosis not present

## 2021-07-03 DIAGNOSIS — I10 Essential (primary) hypertension: Secondary | ICD-10-CM | POA: Diagnosis not present

## 2021-07-08 DIAGNOSIS — M5136 Other intervertebral disc degeneration, lumbar region: Secondary | ICD-10-CM | POA: Diagnosis not present

## 2021-07-08 DIAGNOSIS — M9905 Segmental and somatic dysfunction of pelvic region: Secondary | ICD-10-CM | POA: Diagnosis not present

## 2021-07-08 DIAGNOSIS — M9903 Segmental and somatic dysfunction of lumbar region: Secondary | ICD-10-CM | POA: Diagnosis not present

## 2021-07-08 DIAGNOSIS — M955 Acquired deformity of pelvis: Secondary | ICD-10-CM | POA: Diagnosis not present

## 2021-07-27 DIAGNOSIS — I10 Essential (primary) hypertension: Secondary | ICD-10-CM | POA: Diagnosis not present

## 2021-07-27 DIAGNOSIS — G8929 Other chronic pain: Secondary | ICD-10-CM | POA: Diagnosis not present

## 2021-07-27 DIAGNOSIS — N183 Chronic kidney disease, stage 3 unspecified: Secondary | ICD-10-CM | POA: Diagnosis not present

## 2021-07-27 DIAGNOSIS — K219 Gastro-esophageal reflux disease without esophagitis: Secondary | ICD-10-CM | POA: Diagnosis not present

## 2021-07-27 DIAGNOSIS — E7800 Pure hypercholesterolemia, unspecified: Secondary | ICD-10-CM | POA: Diagnosis not present

## 2021-07-29 DIAGNOSIS — M955 Acquired deformity of pelvis: Secondary | ICD-10-CM | POA: Diagnosis not present

## 2021-07-29 DIAGNOSIS — M9903 Segmental and somatic dysfunction of lumbar region: Secondary | ICD-10-CM | POA: Diagnosis not present

## 2021-07-29 DIAGNOSIS — M9905 Segmental and somatic dysfunction of pelvic region: Secondary | ICD-10-CM | POA: Diagnosis not present

## 2021-07-29 DIAGNOSIS — M5136 Other intervertebral disc degeneration, lumbar region: Secondary | ICD-10-CM | POA: Diagnosis not present

## 2021-08-05 DIAGNOSIS — I10 Essential (primary) hypertension: Secondary | ICD-10-CM | POA: Diagnosis not present

## 2021-08-19 DIAGNOSIS — M9903 Segmental and somatic dysfunction of lumbar region: Secondary | ICD-10-CM | POA: Diagnosis not present

## 2021-08-19 DIAGNOSIS — M5136 Other intervertebral disc degeneration, lumbar region: Secondary | ICD-10-CM | POA: Diagnosis not present

## 2021-08-19 DIAGNOSIS — M9905 Segmental and somatic dysfunction of pelvic region: Secondary | ICD-10-CM | POA: Diagnosis not present

## 2021-08-19 DIAGNOSIS — M955 Acquired deformity of pelvis: Secondary | ICD-10-CM | POA: Diagnosis not present

## 2021-08-21 DIAGNOSIS — K219 Gastro-esophageal reflux disease without esophagitis: Secondary | ICD-10-CM | POA: Diagnosis not present

## 2021-08-21 DIAGNOSIS — E7800 Pure hypercholesterolemia, unspecified: Secondary | ICD-10-CM | POA: Diagnosis not present

## 2021-08-21 DIAGNOSIS — I10 Essential (primary) hypertension: Secondary | ICD-10-CM | POA: Diagnosis not present

## 2021-08-21 DIAGNOSIS — N183 Chronic kidney disease, stage 3 unspecified: Secondary | ICD-10-CM | POA: Diagnosis not present

## 2021-08-21 DIAGNOSIS — N390 Urinary tract infection, site not specified: Secondary | ICD-10-CM | POA: Diagnosis not present

## 2021-08-28 DIAGNOSIS — N183 Chronic kidney disease, stage 3 unspecified: Secondary | ICD-10-CM | POA: Diagnosis not present

## 2021-08-28 DIAGNOSIS — K219 Gastro-esophageal reflux disease without esophagitis: Secondary | ICD-10-CM | POA: Diagnosis not present

## 2021-08-28 DIAGNOSIS — G8929 Other chronic pain: Secondary | ICD-10-CM | POA: Diagnosis not present

## 2021-08-28 DIAGNOSIS — I10 Essential (primary) hypertension: Secondary | ICD-10-CM | POA: Diagnosis not present

## 2021-08-28 DIAGNOSIS — E7800 Pure hypercholesterolemia, unspecified: Secondary | ICD-10-CM | POA: Diagnosis not present

## 2021-09-09 DIAGNOSIS — M9905 Segmental and somatic dysfunction of pelvic region: Secondary | ICD-10-CM | POA: Diagnosis not present

## 2021-09-09 DIAGNOSIS — M5136 Other intervertebral disc degeneration, lumbar region: Secondary | ICD-10-CM | POA: Diagnosis not present

## 2021-09-09 DIAGNOSIS — M955 Acquired deformity of pelvis: Secondary | ICD-10-CM | POA: Diagnosis not present

## 2021-09-09 DIAGNOSIS — M9903 Segmental and somatic dysfunction of lumbar region: Secondary | ICD-10-CM | POA: Diagnosis not present

## 2021-09-23 DIAGNOSIS — I10 Essential (primary) hypertension: Secondary | ICD-10-CM | POA: Diagnosis not present

## 2021-09-23 DIAGNOSIS — N183 Chronic kidney disease, stage 3 unspecified: Secondary | ICD-10-CM | POA: Diagnosis not present

## 2021-09-23 DIAGNOSIS — G8929 Other chronic pain: Secondary | ICD-10-CM | POA: Diagnosis not present

## 2021-09-23 DIAGNOSIS — E7800 Pure hypercholesterolemia, unspecified: Secondary | ICD-10-CM | POA: Diagnosis not present

## 2021-09-23 DIAGNOSIS — K219 Gastro-esophageal reflux disease without esophagitis: Secondary | ICD-10-CM | POA: Diagnosis not present

## 2021-09-30 DIAGNOSIS — M9903 Segmental and somatic dysfunction of lumbar region: Secondary | ICD-10-CM | POA: Diagnosis not present

## 2021-09-30 DIAGNOSIS — M955 Acquired deformity of pelvis: Secondary | ICD-10-CM | POA: Diagnosis not present

## 2021-09-30 DIAGNOSIS — M9905 Segmental and somatic dysfunction of pelvic region: Secondary | ICD-10-CM | POA: Diagnosis not present

## 2021-09-30 DIAGNOSIS — M62830 Muscle spasm of back: Secondary | ICD-10-CM | POA: Diagnosis not present

## 2021-10-14 DIAGNOSIS — L821 Other seborrheic keratosis: Secondary | ICD-10-CM | POA: Diagnosis not present

## 2021-10-14 DIAGNOSIS — D225 Melanocytic nevi of trunk: Secondary | ICD-10-CM | POA: Diagnosis not present

## 2021-10-14 DIAGNOSIS — Z85828 Personal history of other malignant neoplasm of skin: Secondary | ICD-10-CM | POA: Diagnosis not present

## 2021-10-14 DIAGNOSIS — L814 Other melanin hyperpigmentation: Secondary | ICD-10-CM | POA: Diagnosis not present

## 2021-10-14 DIAGNOSIS — Z08 Encounter for follow-up examination after completed treatment for malignant neoplasm: Secondary | ICD-10-CM | POA: Diagnosis not present

## 2021-10-14 DIAGNOSIS — D492 Neoplasm of unspecified behavior of bone, soft tissue, and skin: Secondary | ICD-10-CM | POA: Diagnosis not present

## 2021-10-14 DIAGNOSIS — C44311 Basal cell carcinoma of skin of nose: Secondary | ICD-10-CM | POA: Diagnosis not present

## 2021-10-21 DIAGNOSIS — M955 Acquired deformity of pelvis: Secondary | ICD-10-CM | POA: Diagnosis not present

## 2021-10-21 DIAGNOSIS — M9905 Segmental and somatic dysfunction of pelvic region: Secondary | ICD-10-CM | POA: Diagnosis not present

## 2021-10-21 DIAGNOSIS — M62830 Muscle spasm of back: Secondary | ICD-10-CM | POA: Diagnosis not present

## 2021-10-21 DIAGNOSIS — M9903 Segmental and somatic dysfunction of lumbar region: Secondary | ICD-10-CM | POA: Diagnosis not present

## 2021-11-04 DIAGNOSIS — I10 Essential (primary) hypertension: Secondary | ICD-10-CM | POA: Diagnosis not present

## 2021-11-11 DIAGNOSIS — M9905 Segmental and somatic dysfunction of pelvic region: Secondary | ICD-10-CM | POA: Diagnosis not present

## 2021-11-11 DIAGNOSIS — M5416 Radiculopathy, lumbar region: Secondary | ICD-10-CM | POA: Diagnosis not present

## 2021-11-11 DIAGNOSIS — M5136 Other intervertebral disc degeneration, lumbar region: Secondary | ICD-10-CM | POA: Diagnosis not present

## 2021-11-11 DIAGNOSIS — M9903 Segmental and somatic dysfunction of lumbar region: Secondary | ICD-10-CM | POA: Diagnosis not present

## 2021-11-19 DIAGNOSIS — C44319 Basal cell carcinoma of skin of other parts of face: Secondary | ICD-10-CM | POA: Diagnosis not present

## 2021-11-19 DIAGNOSIS — C4491 Basal cell carcinoma of skin, unspecified: Secondary | ICD-10-CM | POA: Diagnosis not present

## 2021-11-19 DIAGNOSIS — Z481 Encounter for planned postprocedural wound closure: Secondary | ICD-10-CM | POA: Diagnosis not present

## 2021-11-25 DIAGNOSIS — M9903 Segmental and somatic dysfunction of lumbar region: Secondary | ICD-10-CM | POA: Diagnosis not present

## 2021-11-25 DIAGNOSIS — M5416 Radiculopathy, lumbar region: Secondary | ICD-10-CM | POA: Diagnosis not present

## 2021-11-25 DIAGNOSIS — M5136 Other intervertebral disc degeneration, lumbar region: Secondary | ICD-10-CM | POA: Diagnosis not present

## 2021-11-25 DIAGNOSIS — M9905 Segmental and somatic dysfunction of pelvic region: Secondary | ICD-10-CM | POA: Diagnosis not present

## 2021-12-03 DIAGNOSIS — K219 Gastro-esophageal reflux disease without esophagitis: Secondary | ICD-10-CM | POA: Diagnosis not present

## 2021-12-03 DIAGNOSIS — L0390 Cellulitis, unspecified: Secondary | ICD-10-CM | POA: Diagnosis not present

## 2021-12-03 DIAGNOSIS — G8929 Other chronic pain: Secondary | ICD-10-CM | POA: Diagnosis not present

## 2021-12-03 DIAGNOSIS — N183 Chronic kidney disease, stage 3 unspecified: Secondary | ICD-10-CM | POA: Diagnosis not present

## 2021-12-03 DIAGNOSIS — E7800 Pure hypercholesterolemia, unspecified: Secondary | ICD-10-CM | POA: Diagnosis not present

## 2021-12-03 DIAGNOSIS — I10 Essential (primary) hypertension: Secondary | ICD-10-CM | POA: Diagnosis not present

## 2021-12-09 DIAGNOSIS — M9903 Segmental and somatic dysfunction of lumbar region: Secondary | ICD-10-CM | POA: Diagnosis not present

## 2021-12-09 DIAGNOSIS — M9905 Segmental and somatic dysfunction of pelvic region: Secondary | ICD-10-CM | POA: Diagnosis not present

## 2021-12-09 DIAGNOSIS — M955 Acquired deformity of pelvis: Secondary | ICD-10-CM | POA: Diagnosis not present

## 2021-12-09 DIAGNOSIS — M5136 Other intervertebral disc degeneration, lumbar region: Secondary | ICD-10-CM | POA: Diagnosis not present

## 2021-12-11 DIAGNOSIS — L03116 Cellulitis of left lower limb: Secondary | ICD-10-CM | POA: Diagnosis not present

## 2021-12-11 DIAGNOSIS — L03115 Cellulitis of right lower limb: Secondary | ICD-10-CM | POA: Diagnosis not present

## 2021-12-18 DIAGNOSIS — L03116 Cellulitis of left lower limb: Secondary | ICD-10-CM | POA: Diagnosis not present

## 2021-12-18 DIAGNOSIS — L03115 Cellulitis of right lower limb: Secondary | ICD-10-CM | POA: Diagnosis not present

## 2021-12-23 DIAGNOSIS — M5136 Other intervertebral disc degeneration, lumbar region: Secondary | ICD-10-CM | POA: Diagnosis not present

## 2021-12-23 DIAGNOSIS — M9903 Segmental and somatic dysfunction of lumbar region: Secondary | ICD-10-CM | POA: Diagnosis not present

## 2021-12-23 DIAGNOSIS — M9905 Segmental and somatic dysfunction of pelvic region: Secondary | ICD-10-CM | POA: Diagnosis not present

## 2021-12-23 DIAGNOSIS — M955 Acquired deformity of pelvis: Secondary | ICD-10-CM | POA: Diagnosis not present

## 2021-12-31 DIAGNOSIS — L853 Xerosis cutis: Secondary | ICD-10-CM | POA: Diagnosis not present

## 2022-01-06 DIAGNOSIS — M9903 Segmental and somatic dysfunction of lumbar region: Secondary | ICD-10-CM | POA: Diagnosis not present

## 2022-01-06 DIAGNOSIS — M955 Acquired deformity of pelvis: Secondary | ICD-10-CM | POA: Diagnosis not present

## 2022-01-06 DIAGNOSIS — M5136 Other intervertebral disc degeneration, lumbar region: Secondary | ICD-10-CM | POA: Diagnosis not present

## 2022-01-06 DIAGNOSIS — M9905 Segmental and somatic dysfunction of pelvic region: Secondary | ICD-10-CM | POA: Diagnosis not present

## 2022-01-20 DIAGNOSIS — M9905 Segmental and somatic dysfunction of pelvic region: Secondary | ICD-10-CM | POA: Diagnosis not present

## 2022-01-20 DIAGNOSIS — M955 Acquired deformity of pelvis: Secondary | ICD-10-CM | POA: Diagnosis not present

## 2022-01-20 DIAGNOSIS — M9903 Segmental and somatic dysfunction of lumbar region: Secondary | ICD-10-CM | POA: Diagnosis not present

## 2022-01-20 DIAGNOSIS — M5136 Other intervertebral disc degeneration, lumbar region: Secondary | ICD-10-CM | POA: Diagnosis not present

## 2022-02-03 DIAGNOSIS — M5136 Other intervertebral disc degeneration, lumbar region: Secondary | ICD-10-CM | POA: Diagnosis not present

## 2022-02-03 DIAGNOSIS — M9903 Segmental and somatic dysfunction of lumbar region: Secondary | ICD-10-CM | POA: Diagnosis not present

## 2022-02-03 DIAGNOSIS — M955 Acquired deformity of pelvis: Secondary | ICD-10-CM | POA: Diagnosis not present

## 2022-02-03 DIAGNOSIS — M9905 Segmental and somatic dysfunction of pelvic region: Secondary | ICD-10-CM | POA: Diagnosis not present

## 2022-02-24 DIAGNOSIS — M9905 Segmental and somatic dysfunction of pelvic region: Secondary | ICD-10-CM | POA: Diagnosis not present

## 2022-02-24 DIAGNOSIS — M5136 Other intervertebral disc degeneration, lumbar region: Secondary | ICD-10-CM | POA: Diagnosis not present

## 2022-02-24 DIAGNOSIS — M955 Acquired deformity of pelvis: Secondary | ICD-10-CM | POA: Diagnosis not present

## 2022-02-24 DIAGNOSIS — M9903 Segmental and somatic dysfunction of lumbar region: Secondary | ICD-10-CM | POA: Diagnosis not present

## 2022-02-24 DIAGNOSIS — I10 Essential (primary) hypertension: Secondary | ICD-10-CM | POA: Diagnosis not present

## 2022-02-24 DIAGNOSIS — E7800 Pure hypercholesterolemia, unspecified: Secondary | ICD-10-CM | POA: Diagnosis not present

## 2022-02-26 DIAGNOSIS — E7800 Pure hypercholesterolemia, unspecified: Secondary | ICD-10-CM | POA: Diagnosis not present

## 2022-02-26 DIAGNOSIS — I872 Venous insufficiency (chronic) (peripheral): Secondary | ICD-10-CM | POA: Diagnosis not present

## 2022-02-26 DIAGNOSIS — L219 Seborrheic dermatitis, unspecified: Secondary | ICD-10-CM | POA: Diagnosis not present

## 2022-02-26 DIAGNOSIS — Z0000 Encounter for general adult medical examination without abnormal findings: Secondary | ICD-10-CM | POA: Diagnosis not present

## 2022-02-26 DIAGNOSIS — N390 Urinary tract infection, site not specified: Secondary | ICD-10-CM | POA: Diagnosis not present

## 2022-02-26 DIAGNOSIS — N183 Chronic kidney disease, stage 3 unspecified: Secondary | ICD-10-CM | POA: Diagnosis not present

## 2022-02-26 DIAGNOSIS — I10 Essential (primary) hypertension: Secondary | ICD-10-CM | POA: Diagnosis not present

## 2022-02-26 DIAGNOSIS — K219 Gastro-esophageal reflux disease without esophagitis: Secondary | ICD-10-CM | POA: Diagnosis not present

## 2022-03-17 DIAGNOSIS — M9903 Segmental and somatic dysfunction of lumbar region: Secondary | ICD-10-CM | POA: Diagnosis not present

## 2022-03-17 DIAGNOSIS — M955 Acquired deformity of pelvis: Secondary | ICD-10-CM | POA: Diagnosis not present

## 2022-03-17 DIAGNOSIS — M9905 Segmental and somatic dysfunction of pelvic region: Secondary | ICD-10-CM | POA: Diagnosis not present

## 2022-03-17 DIAGNOSIS — M5136 Other intervertebral disc degeneration, lumbar region: Secondary | ICD-10-CM | POA: Diagnosis not present

## 2022-03-30 DIAGNOSIS — M955 Acquired deformity of pelvis: Secondary | ICD-10-CM | POA: Diagnosis not present

## 2022-03-30 DIAGNOSIS — M9903 Segmental and somatic dysfunction of lumbar region: Secondary | ICD-10-CM | POA: Diagnosis not present

## 2022-03-30 DIAGNOSIS — M9905 Segmental and somatic dysfunction of pelvic region: Secondary | ICD-10-CM | POA: Diagnosis not present

## 2022-03-30 DIAGNOSIS — M5136 Other intervertebral disc degeneration, lumbar region: Secondary | ICD-10-CM | POA: Diagnosis not present

## 2022-04-13 DIAGNOSIS — H2513 Age-related nuclear cataract, bilateral: Secondary | ICD-10-CM | POA: Diagnosis not present

## 2022-04-13 DIAGNOSIS — H5203 Hypermetropia, bilateral: Secondary | ICD-10-CM | POA: Diagnosis not present

## 2022-04-14 DIAGNOSIS — M955 Acquired deformity of pelvis: Secondary | ICD-10-CM | POA: Diagnosis not present

## 2022-04-14 DIAGNOSIS — M9903 Segmental and somatic dysfunction of lumbar region: Secondary | ICD-10-CM | POA: Diagnosis not present

## 2022-04-14 DIAGNOSIS — M9905 Segmental and somatic dysfunction of pelvic region: Secondary | ICD-10-CM | POA: Diagnosis not present

## 2022-04-14 DIAGNOSIS — M5136 Other intervertebral disc degeneration, lumbar region: Secondary | ICD-10-CM | POA: Diagnosis not present

## 2022-04-20 DIAGNOSIS — M9905 Segmental and somatic dysfunction of pelvic region: Secondary | ICD-10-CM | POA: Diagnosis not present

## 2022-04-20 DIAGNOSIS — M955 Acquired deformity of pelvis: Secondary | ICD-10-CM | POA: Diagnosis not present

## 2022-04-20 DIAGNOSIS — M5136 Other intervertebral disc degeneration, lumbar region: Secondary | ICD-10-CM | POA: Diagnosis not present

## 2022-04-20 DIAGNOSIS — M9903 Segmental and somatic dysfunction of lumbar region: Secondary | ICD-10-CM | POA: Diagnosis not present

## 2022-05-05 DIAGNOSIS — M955 Acquired deformity of pelvis: Secondary | ICD-10-CM | POA: Diagnosis not present

## 2022-05-05 DIAGNOSIS — M9905 Segmental and somatic dysfunction of pelvic region: Secondary | ICD-10-CM | POA: Diagnosis not present

## 2022-05-05 DIAGNOSIS — M5136 Other intervertebral disc degeneration, lumbar region: Secondary | ICD-10-CM | POA: Diagnosis not present

## 2022-05-05 DIAGNOSIS — M9903 Segmental and somatic dysfunction of lumbar region: Secondary | ICD-10-CM | POA: Diagnosis not present

## 2022-05-11 DIAGNOSIS — M9903 Segmental and somatic dysfunction of lumbar region: Secondary | ICD-10-CM | POA: Diagnosis not present

## 2022-05-11 DIAGNOSIS — M955 Acquired deformity of pelvis: Secondary | ICD-10-CM | POA: Diagnosis not present

## 2022-05-11 DIAGNOSIS — M5136 Other intervertebral disc degeneration, lumbar region: Secondary | ICD-10-CM | POA: Diagnosis not present

## 2022-05-11 DIAGNOSIS — M9905 Segmental and somatic dysfunction of pelvic region: Secondary | ICD-10-CM | POA: Diagnosis not present

## 2022-05-13 DIAGNOSIS — H04123 Dry eye syndrome of bilateral lacrimal glands: Secondary | ICD-10-CM | POA: Diagnosis not present

## 2022-05-26 DIAGNOSIS — M9903 Segmental and somatic dysfunction of lumbar region: Secondary | ICD-10-CM | POA: Diagnosis not present

## 2022-05-26 DIAGNOSIS — M5136 Other intervertebral disc degeneration, lumbar region: Secondary | ICD-10-CM | POA: Diagnosis not present

## 2022-05-26 DIAGNOSIS — M955 Acquired deformity of pelvis: Secondary | ICD-10-CM | POA: Diagnosis not present

## 2022-05-26 DIAGNOSIS — M9905 Segmental and somatic dysfunction of pelvic region: Secondary | ICD-10-CM | POA: Diagnosis not present

## 2022-06-16 DIAGNOSIS — M9905 Segmental and somatic dysfunction of pelvic region: Secondary | ICD-10-CM | POA: Diagnosis not present

## 2022-06-16 DIAGNOSIS — M9903 Segmental and somatic dysfunction of lumbar region: Secondary | ICD-10-CM | POA: Diagnosis not present

## 2022-06-16 DIAGNOSIS — M955 Acquired deformity of pelvis: Secondary | ICD-10-CM | POA: Diagnosis not present

## 2022-06-16 DIAGNOSIS — M5136 Other intervertebral disc degeneration, lumbar region: Secondary | ICD-10-CM | POA: Diagnosis not present

## 2022-07-07 DIAGNOSIS — M9905 Segmental and somatic dysfunction of pelvic region: Secondary | ICD-10-CM | POA: Diagnosis not present

## 2022-07-07 DIAGNOSIS — M955 Acquired deformity of pelvis: Secondary | ICD-10-CM | POA: Diagnosis not present

## 2022-07-07 DIAGNOSIS — M5136 Other intervertebral disc degeneration, lumbar region: Secondary | ICD-10-CM | POA: Diagnosis not present

## 2022-07-07 DIAGNOSIS — M9903 Segmental and somatic dysfunction of lumbar region: Secondary | ICD-10-CM | POA: Diagnosis not present

## 2022-07-28 DIAGNOSIS — M9903 Segmental and somatic dysfunction of lumbar region: Secondary | ICD-10-CM | POA: Diagnosis not present

## 2022-07-28 DIAGNOSIS — M9905 Segmental and somatic dysfunction of pelvic region: Secondary | ICD-10-CM | POA: Diagnosis not present

## 2022-07-28 DIAGNOSIS — M5136 Other intervertebral disc degeneration, lumbar region: Secondary | ICD-10-CM | POA: Diagnosis not present

## 2022-07-28 DIAGNOSIS — M955 Acquired deformity of pelvis: Secondary | ICD-10-CM | POA: Diagnosis not present

## 2022-08-18 DIAGNOSIS — M5136 Other intervertebral disc degeneration, lumbar region: Secondary | ICD-10-CM | POA: Diagnosis not present

## 2022-08-18 DIAGNOSIS — M955 Acquired deformity of pelvis: Secondary | ICD-10-CM | POA: Diagnosis not present

## 2022-08-18 DIAGNOSIS — M9905 Segmental and somatic dysfunction of pelvic region: Secondary | ICD-10-CM | POA: Diagnosis not present

## 2022-08-18 DIAGNOSIS — M9903 Segmental and somatic dysfunction of lumbar region: Secondary | ICD-10-CM | POA: Diagnosis not present

## 2022-08-25 DIAGNOSIS — N390 Urinary tract infection, site not specified: Secondary | ICD-10-CM | POA: Diagnosis not present

## 2022-08-25 DIAGNOSIS — K219 Gastro-esophageal reflux disease without esophagitis: Secondary | ICD-10-CM | POA: Diagnosis not present

## 2022-08-25 DIAGNOSIS — E7800 Pure hypercholesterolemia, unspecified: Secondary | ICD-10-CM | POA: Diagnosis not present

## 2022-08-25 DIAGNOSIS — I872 Venous insufficiency (chronic) (peripheral): Secondary | ICD-10-CM | POA: Diagnosis not present

## 2022-08-25 DIAGNOSIS — I10 Essential (primary) hypertension: Secondary | ICD-10-CM | POA: Diagnosis not present

## 2022-08-25 DIAGNOSIS — L219 Seborrheic dermatitis, unspecified: Secondary | ICD-10-CM | POA: Diagnosis not present

## 2022-08-25 DIAGNOSIS — N183 Chronic kidney disease, stage 3 unspecified: Secondary | ICD-10-CM | POA: Diagnosis not present

## 2022-09-08 DIAGNOSIS — M5136 Other intervertebral disc degeneration, lumbar region: Secondary | ICD-10-CM | POA: Diagnosis not present

## 2022-09-08 DIAGNOSIS — M955 Acquired deformity of pelvis: Secondary | ICD-10-CM | POA: Diagnosis not present

## 2022-09-08 DIAGNOSIS — M9903 Segmental and somatic dysfunction of lumbar region: Secondary | ICD-10-CM | POA: Diagnosis not present

## 2022-09-08 DIAGNOSIS — M9905 Segmental and somatic dysfunction of pelvic region: Secondary | ICD-10-CM | POA: Diagnosis not present

## 2022-09-29 DIAGNOSIS — M9903 Segmental and somatic dysfunction of lumbar region: Secondary | ICD-10-CM | POA: Diagnosis not present

## 2022-09-29 DIAGNOSIS — M5136 Other intervertebral disc degeneration, lumbar region: Secondary | ICD-10-CM | POA: Diagnosis not present

## 2022-09-29 DIAGNOSIS — M9905 Segmental and somatic dysfunction of pelvic region: Secondary | ICD-10-CM | POA: Diagnosis not present

## 2022-09-29 DIAGNOSIS — M955 Acquired deformity of pelvis: Secondary | ICD-10-CM | POA: Diagnosis not present

## 2022-10-20 DIAGNOSIS — M9905 Segmental and somatic dysfunction of pelvic region: Secondary | ICD-10-CM | POA: Diagnosis not present

## 2022-10-20 DIAGNOSIS — M9903 Segmental and somatic dysfunction of lumbar region: Secondary | ICD-10-CM | POA: Diagnosis not present

## 2022-10-20 DIAGNOSIS — M5136 Other intervertebral disc degeneration, lumbar region: Secondary | ICD-10-CM | POA: Diagnosis not present

## 2022-10-20 DIAGNOSIS — M955 Acquired deformity of pelvis: Secondary | ICD-10-CM | POA: Diagnosis not present

## 2022-10-25 DIAGNOSIS — M5136 Other intervertebral disc degeneration, lumbar region: Secondary | ICD-10-CM | POA: Diagnosis not present

## 2022-10-25 DIAGNOSIS — M955 Acquired deformity of pelvis: Secondary | ICD-10-CM | POA: Diagnosis not present

## 2022-10-25 DIAGNOSIS — M9903 Segmental and somatic dysfunction of lumbar region: Secondary | ICD-10-CM | POA: Diagnosis not present

## 2022-10-25 DIAGNOSIS — M9905 Segmental and somatic dysfunction of pelvic region: Secondary | ICD-10-CM | POA: Diagnosis not present

## 2022-11-02 DIAGNOSIS — M9905 Segmental and somatic dysfunction of pelvic region: Secondary | ICD-10-CM | POA: Diagnosis not present

## 2022-11-02 DIAGNOSIS — M955 Acquired deformity of pelvis: Secondary | ICD-10-CM | POA: Diagnosis not present

## 2022-11-02 DIAGNOSIS — M5136 Other intervertebral disc degeneration, lumbar region: Secondary | ICD-10-CM | POA: Diagnosis not present

## 2022-11-02 DIAGNOSIS — M9903 Segmental and somatic dysfunction of lumbar region: Secondary | ICD-10-CM | POA: Diagnosis not present

## 2022-11-09 DIAGNOSIS — M5136 Other intervertebral disc degeneration, lumbar region: Secondary | ICD-10-CM | POA: Diagnosis not present

## 2022-11-09 DIAGNOSIS — M955 Acquired deformity of pelvis: Secondary | ICD-10-CM | POA: Diagnosis not present

## 2022-11-09 DIAGNOSIS — M9903 Segmental and somatic dysfunction of lumbar region: Secondary | ICD-10-CM | POA: Diagnosis not present

## 2022-11-09 DIAGNOSIS — M9905 Segmental and somatic dysfunction of pelvic region: Secondary | ICD-10-CM | POA: Diagnosis not present

## 2022-11-11 DIAGNOSIS — H2513 Age-related nuclear cataract, bilateral: Secondary | ICD-10-CM | POA: Diagnosis not present

## 2022-11-11 DIAGNOSIS — H524 Presbyopia: Secondary | ICD-10-CM | POA: Diagnosis not present

## 2022-11-16 DIAGNOSIS — M9905 Segmental and somatic dysfunction of pelvic region: Secondary | ICD-10-CM | POA: Diagnosis not present

## 2022-11-16 DIAGNOSIS — M9903 Segmental and somatic dysfunction of lumbar region: Secondary | ICD-10-CM | POA: Diagnosis not present

## 2022-11-16 DIAGNOSIS — M955 Acquired deformity of pelvis: Secondary | ICD-10-CM | POA: Diagnosis not present

## 2022-11-16 DIAGNOSIS — M5136 Other intervertebral disc degeneration, lumbar region: Secondary | ICD-10-CM | POA: Diagnosis not present

## 2022-11-23 DIAGNOSIS — M9903 Segmental and somatic dysfunction of lumbar region: Secondary | ICD-10-CM | POA: Diagnosis not present

## 2022-11-23 DIAGNOSIS — M9905 Segmental and somatic dysfunction of pelvic region: Secondary | ICD-10-CM | POA: Diagnosis not present

## 2022-11-23 DIAGNOSIS — M5136 Other intervertebral disc degeneration, lumbar region: Secondary | ICD-10-CM | POA: Diagnosis not present

## 2022-11-23 DIAGNOSIS — M955 Acquired deformity of pelvis: Secondary | ICD-10-CM | POA: Diagnosis not present

## 2022-11-24 DIAGNOSIS — I872 Venous insufficiency (chronic) (peripheral): Secondary | ICD-10-CM | POA: Diagnosis not present

## 2022-11-24 DIAGNOSIS — L03115 Cellulitis of right lower limb: Secondary | ICD-10-CM | POA: Diagnosis not present

## 2022-11-24 DIAGNOSIS — I10 Essential (primary) hypertension: Secondary | ICD-10-CM | POA: Diagnosis not present

## 2022-11-30 DIAGNOSIS — I872 Venous insufficiency (chronic) (peripheral): Secondary | ICD-10-CM | POA: Diagnosis not present

## 2022-11-30 DIAGNOSIS — I10 Essential (primary) hypertension: Secondary | ICD-10-CM | POA: Diagnosis not present

## 2022-11-30 DIAGNOSIS — R600 Localized edema: Secondary | ICD-10-CM | POA: Diagnosis not present

## 2022-11-30 DIAGNOSIS — L03116 Cellulitis of left lower limb: Secondary | ICD-10-CM | POA: Diagnosis not present

## 2022-12-07 DIAGNOSIS — M9903 Segmental and somatic dysfunction of lumbar region: Secondary | ICD-10-CM | POA: Diagnosis not present

## 2022-12-07 DIAGNOSIS — M9905 Segmental and somatic dysfunction of pelvic region: Secondary | ICD-10-CM | POA: Diagnosis not present

## 2022-12-07 DIAGNOSIS — M955 Acquired deformity of pelvis: Secondary | ICD-10-CM | POA: Diagnosis not present

## 2022-12-07 DIAGNOSIS — M5136 Other intervertebral disc degeneration, lumbar region: Secondary | ICD-10-CM | POA: Diagnosis not present

## 2022-12-22 DIAGNOSIS — M9905 Segmental and somatic dysfunction of pelvic region: Secondary | ICD-10-CM | POA: Diagnosis not present

## 2022-12-22 DIAGNOSIS — M9903 Segmental and somatic dysfunction of lumbar region: Secondary | ICD-10-CM | POA: Diagnosis not present

## 2022-12-22 DIAGNOSIS — M5136 Other intervertebral disc degeneration, lumbar region: Secondary | ICD-10-CM | POA: Diagnosis not present

## 2022-12-22 DIAGNOSIS — M955 Acquired deformity of pelvis: Secondary | ICD-10-CM | POA: Diagnosis not present

## 2023-01-05 DIAGNOSIS — M955 Acquired deformity of pelvis: Secondary | ICD-10-CM | POA: Diagnosis not present

## 2023-01-05 DIAGNOSIS — M9905 Segmental and somatic dysfunction of pelvic region: Secondary | ICD-10-CM | POA: Diagnosis not present

## 2023-01-05 DIAGNOSIS — M9903 Segmental and somatic dysfunction of lumbar region: Secondary | ICD-10-CM | POA: Diagnosis not present

## 2023-01-05 DIAGNOSIS — M5136 Other intervertebral disc degeneration, lumbar region: Secondary | ICD-10-CM | POA: Diagnosis not present

## 2023-01-19 DIAGNOSIS — M5136 Other intervertebral disc degeneration, lumbar region: Secondary | ICD-10-CM | POA: Diagnosis not present

## 2023-01-19 DIAGNOSIS — M955 Acquired deformity of pelvis: Secondary | ICD-10-CM | POA: Diagnosis not present

## 2023-01-19 DIAGNOSIS — M9905 Segmental and somatic dysfunction of pelvic region: Secondary | ICD-10-CM | POA: Diagnosis not present

## 2023-01-19 DIAGNOSIS — M9903 Segmental and somatic dysfunction of lumbar region: Secondary | ICD-10-CM | POA: Diagnosis not present

## 2023-02-02 DIAGNOSIS — M5136 Other intervertebral disc degeneration, lumbar region: Secondary | ICD-10-CM | POA: Diagnosis not present

## 2023-02-02 DIAGNOSIS — M9903 Segmental and somatic dysfunction of lumbar region: Secondary | ICD-10-CM | POA: Diagnosis not present

## 2023-02-02 DIAGNOSIS — M9905 Segmental and somatic dysfunction of pelvic region: Secondary | ICD-10-CM | POA: Diagnosis not present

## 2023-02-02 DIAGNOSIS — M955 Acquired deformity of pelvis: Secondary | ICD-10-CM | POA: Diagnosis not present

## 2023-02-16 DIAGNOSIS — M9903 Segmental and somatic dysfunction of lumbar region: Secondary | ICD-10-CM | POA: Diagnosis not present

## 2023-02-16 DIAGNOSIS — M9905 Segmental and somatic dysfunction of pelvic region: Secondary | ICD-10-CM | POA: Diagnosis not present

## 2023-02-16 DIAGNOSIS — M955 Acquired deformity of pelvis: Secondary | ICD-10-CM | POA: Diagnosis not present

## 2023-02-16 DIAGNOSIS — M5136 Other intervertebral disc degeneration, lumbar region: Secondary | ICD-10-CM | POA: Diagnosis not present

## 2023-03-08 DIAGNOSIS — M9903 Segmental and somatic dysfunction of lumbar region: Secondary | ICD-10-CM | POA: Diagnosis not present

## 2023-03-08 DIAGNOSIS — M955 Acquired deformity of pelvis: Secondary | ICD-10-CM | POA: Diagnosis not present

## 2023-03-08 DIAGNOSIS — M5136 Other intervertebral disc degeneration, lumbar region: Secondary | ICD-10-CM | POA: Diagnosis not present

## 2023-03-08 DIAGNOSIS — M9905 Segmental and somatic dysfunction of pelvic region: Secondary | ICD-10-CM | POA: Diagnosis not present

## 2023-03-14 DIAGNOSIS — I10 Essential (primary) hypertension: Secondary | ICD-10-CM | POA: Diagnosis not present

## 2023-03-14 DIAGNOSIS — K219 Gastro-esophageal reflux disease without esophagitis: Secondary | ICD-10-CM | POA: Diagnosis not present

## 2023-03-14 DIAGNOSIS — E7800 Pure hypercholesterolemia, unspecified: Secondary | ICD-10-CM | POA: Diagnosis not present

## 2023-03-14 DIAGNOSIS — N390 Urinary tract infection, site not specified: Secondary | ICD-10-CM | POA: Diagnosis not present

## 2023-03-14 DIAGNOSIS — I872 Venous insufficiency (chronic) (peripheral): Secondary | ICD-10-CM | POA: Diagnosis not present

## 2023-03-14 DIAGNOSIS — Z0000 Encounter for general adult medical examination without abnormal findings: Secondary | ICD-10-CM | POA: Diagnosis not present

## 2023-03-14 DIAGNOSIS — L219 Seborrheic dermatitis, unspecified: Secondary | ICD-10-CM | POA: Diagnosis not present

## 2023-03-14 DIAGNOSIS — N183 Chronic kidney disease, stage 3 unspecified: Secondary | ICD-10-CM | POA: Diagnosis not present

## 2023-03-21 DIAGNOSIS — M5136 Other intervertebral disc degeneration, lumbar region: Secondary | ICD-10-CM | POA: Diagnosis not present

## 2023-03-21 DIAGNOSIS — M9903 Segmental and somatic dysfunction of lumbar region: Secondary | ICD-10-CM | POA: Diagnosis not present

## 2023-03-21 DIAGNOSIS — M9905 Segmental and somatic dysfunction of pelvic region: Secondary | ICD-10-CM | POA: Diagnosis not present

## 2023-03-21 DIAGNOSIS — M955 Acquired deformity of pelvis: Secondary | ICD-10-CM | POA: Diagnosis not present

## 2023-04-05 DIAGNOSIS — M9905 Segmental and somatic dysfunction of pelvic region: Secondary | ICD-10-CM | POA: Diagnosis not present

## 2023-04-05 DIAGNOSIS — M9903 Segmental and somatic dysfunction of lumbar region: Secondary | ICD-10-CM | POA: Diagnosis not present

## 2023-04-05 DIAGNOSIS — M955 Acquired deformity of pelvis: Secondary | ICD-10-CM | POA: Diagnosis not present

## 2023-04-05 DIAGNOSIS — M5136 Other intervertebral disc degeneration, lumbar region: Secondary | ICD-10-CM | POA: Diagnosis not present

## 2023-04-19 DIAGNOSIS — M955 Acquired deformity of pelvis: Secondary | ICD-10-CM | POA: Diagnosis not present

## 2023-04-19 DIAGNOSIS — M5136 Other intervertebral disc degeneration, lumbar region: Secondary | ICD-10-CM | POA: Diagnosis not present

## 2023-04-19 DIAGNOSIS — M9903 Segmental and somatic dysfunction of lumbar region: Secondary | ICD-10-CM | POA: Diagnosis not present

## 2023-04-19 DIAGNOSIS — M9905 Segmental and somatic dysfunction of pelvic region: Secondary | ICD-10-CM | POA: Diagnosis not present

## 2023-05-03 DIAGNOSIS — M5136 Other intervertebral disc degeneration, lumbar region: Secondary | ICD-10-CM | POA: Diagnosis not present

## 2023-05-03 DIAGNOSIS — M9905 Segmental and somatic dysfunction of pelvic region: Secondary | ICD-10-CM | POA: Diagnosis not present

## 2023-05-03 DIAGNOSIS — M9903 Segmental and somatic dysfunction of lumbar region: Secondary | ICD-10-CM | POA: Diagnosis not present

## 2023-05-03 DIAGNOSIS — M955 Acquired deformity of pelvis: Secondary | ICD-10-CM | POA: Diagnosis not present

## 2023-05-24 DIAGNOSIS — M9905 Segmental and somatic dysfunction of pelvic region: Secondary | ICD-10-CM | POA: Diagnosis not present

## 2023-05-24 DIAGNOSIS — M955 Acquired deformity of pelvis: Secondary | ICD-10-CM | POA: Diagnosis not present

## 2023-05-24 DIAGNOSIS — M5136 Other intervertebral disc degeneration, lumbar region: Secondary | ICD-10-CM | POA: Diagnosis not present

## 2023-05-24 DIAGNOSIS — M9903 Segmental and somatic dysfunction of lumbar region: Secondary | ICD-10-CM | POA: Diagnosis not present

## 2023-06-14 DIAGNOSIS — M9903 Segmental and somatic dysfunction of lumbar region: Secondary | ICD-10-CM | POA: Diagnosis not present

## 2023-06-14 DIAGNOSIS — M9905 Segmental and somatic dysfunction of pelvic region: Secondary | ICD-10-CM | POA: Diagnosis not present

## 2023-06-14 DIAGNOSIS — M955 Acquired deformity of pelvis: Secondary | ICD-10-CM | POA: Diagnosis not present

## 2023-06-14 DIAGNOSIS — M5136 Other intervertebral disc degeneration, lumbar region: Secondary | ICD-10-CM | POA: Diagnosis not present

## 2023-07-05 DIAGNOSIS — M955 Acquired deformity of pelvis: Secondary | ICD-10-CM | POA: Diagnosis not present

## 2023-07-05 DIAGNOSIS — M5136 Other intervertebral disc degeneration, lumbar region: Secondary | ICD-10-CM | POA: Diagnosis not present

## 2023-07-05 DIAGNOSIS — M9905 Segmental and somatic dysfunction of pelvic region: Secondary | ICD-10-CM | POA: Diagnosis not present

## 2023-07-05 DIAGNOSIS — M9903 Segmental and somatic dysfunction of lumbar region: Secondary | ICD-10-CM | POA: Diagnosis not present

## 2023-07-27 DIAGNOSIS — M955 Acquired deformity of pelvis: Secondary | ICD-10-CM | POA: Diagnosis not present

## 2023-07-27 DIAGNOSIS — M9903 Segmental and somatic dysfunction of lumbar region: Secondary | ICD-10-CM | POA: Diagnosis not present

## 2023-07-27 DIAGNOSIS — M5136 Other intervertebral disc degeneration, lumbar region: Secondary | ICD-10-CM | POA: Diagnosis not present

## 2023-07-27 DIAGNOSIS — M9905 Segmental and somatic dysfunction of pelvic region: Secondary | ICD-10-CM | POA: Diagnosis not present

## 2023-08-16 DIAGNOSIS — M5136 Other intervertebral disc degeneration, lumbar region: Secondary | ICD-10-CM | POA: Diagnosis not present

## 2023-08-16 DIAGNOSIS — M9903 Segmental and somatic dysfunction of lumbar region: Secondary | ICD-10-CM | POA: Diagnosis not present

## 2023-08-16 DIAGNOSIS — M9905 Segmental and somatic dysfunction of pelvic region: Secondary | ICD-10-CM | POA: Diagnosis not present

## 2023-08-16 DIAGNOSIS — M955 Acquired deformity of pelvis: Secondary | ICD-10-CM | POA: Diagnosis not present

## 2023-09-06 DIAGNOSIS — M9903 Segmental and somatic dysfunction of lumbar region: Secondary | ICD-10-CM | POA: Diagnosis not present

## 2023-09-06 DIAGNOSIS — M955 Acquired deformity of pelvis: Secondary | ICD-10-CM | POA: Diagnosis not present

## 2023-09-06 DIAGNOSIS — M5136 Other intervertebral disc degeneration, lumbar region with discogenic back pain only: Secondary | ICD-10-CM | POA: Diagnosis not present

## 2023-09-06 DIAGNOSIS — M9905 Segmental and somatic dysfunction of pelvic region: Secondary | ICD-10-CM | POA: Diagnosis not present

## 2023-09-19 DIAGNOSIS — Z23 Encounter for immunization: Secondary | ICD-10-CM | POA: Diagnosis not present

## 2023-09-19 DIAGNOSIS — E7800 Pure hypercholesterolemia, unspecified: Secondary | ICD-10-CM | POA: Diagnosis not present

## 2023-09-19 DIAGNOSIS — K219 Gastro-esophageal reflux disease without esophagitis: Secondary | ICD-10-CM | POA: Diagnosis not present

## 2023-09-19 DIAGNOSIS — N390 Urinary tract infection, site not specified: Secondary | ICD-10-CM | POA: Diagnosis not present

## 2023-09-19 DIAGNOSIS — N183 Chronic kidney disease, stage 3 unspecified: Secondary | ICD-10-CM | POA: Diagnosis not present

## 2023-09-19 DIAGNOSIS — I872 Venous insufficiency (chronic) (peripheral): Secondary | ICD-10-CM | POA: Diagnosis not present

## 2023-09-19 DIAGNOSIS — I10 Essential (primary) hypertension: Secondary | ICD-10-CM | POA: Diagnosis not present

## 2023-09-27 DIAGNOSIS — M9903 Segmental and somatic dysfunction of lumbar region: Secondary | ICD-10-CM | POA: Diagnosis not present

## 2023-09-27 DIAGNOSIS — M9905 Segmental and somatic dysfunction of pelvic region: Secondary | ICD-10-CM | POA: Diagnosis not present

## 2023-09-27 DIAGNOSIS — M5136 Other intervertebral disc degeneration, lumbar region with discogenic back pain only: Secondary | ICD-10-CM | POA: Diagnosis not present

## 2023-09-27 DIAGNOSIS — M955 Acquired deformity of pelvis: Secondary | ICD-10-CM | POA: Diagnosis not present

## 2023-10-18 DIAGNOSIS — M9905 Segmental and somatic dysfunction of pelvic region: Secondary | ICD-10-CM | POA: Diagnosis not present

## 2023-10-18 DIAGNOSIS — M9903 Segmental and somatic dysfunction of lumbar region: Secondary | ICD-10-CM | POA: Diagnosis not present

## 2023-10-18 DIAGNOSIS — M955 Acquired deformity of pelvis: Secondary | ICD-10-CM | POA: Diagnosis not present

## 2023-10-18 DIAGNOSIS — M5136 Other intervertebral disc degeneration, lumbar region with discogenic back pain only: Secondary | ICD-10-CM | POA: Diagnosis not present

## 2023-11-08 DIAGNOSIS — M955 Acquired deformity of pelvis: Secondary | ICD-10-CM | POA: Diagnosis not present

## 2023-11-08 DIAGNOSIS — M5136 Other intervertebral disc degeneration, lumbar region with discogenic back pain only: Secondary | ICD-10-CM | POA: Diagnosis not present

## 2023-11-08 DIAGNOSIS — M9903 Segmental and somatic dysfunction of lumbar region: Secondary | ICD-10-CM | POA: Diagnosis not present

## 2023-11-08 DIAGNOSIS — M9905 Segmental and somatic dysfunction of pelvic region: Secondary | ICD-10-CM | POA: Diagnosis not present

## 2023-11-17 DIAGNOSIS — H524 Presbyopia: Secondary | ICD-10-CM | POA: Diagnosis not present

## 2023-11-17 DIAGNOSIS — H2513 Age-related nuclear cataract, bilateral: Secondary | ICD-10-CM | POA: Diagnosis not present

## 2023-11-17 DIAGNOSIS — H04123 Dry eye syndrome of bilateral lacrimal glands: Secondary | ICD-10-CM | POA: Diagnosis not present

## 2023-11-28 DIAGNOSIS — M955 Acquired deformity of pelvis: Secondary | ICD-10-CM | POA: Diagnosis not present

## 2023-11-28 DIAGNOSIS — M9903 Segmental and somatic dysfunction of lumbar region: Secondary | ICD-10-CM | POA: Diagnosis not present

## 2023-11-28 DIAGNOSIS — M9905 Segmental and somatic dysfunction of pelvic region: Secondary | ICD-10-CM | POA: Diagnosis not present

## 2023-11-28 DIAGNOSIS — M5136 Other intervertebral disc degeneration, lumbar region with discogenic back pain only: Secondary | ICD-10-CM | POA: Diagnosis not present

## 2023-12-13 DIAGNOSIS — Z6829 Body mass index (BMI) 29.0-29.9, adult: Secondary | ICD-10-CM | POA: Diagnosis not present

## 2023-12-13 DIAGNOSIS — J0140 Acute pansinusitis, unspecified: Secondary | ICD-10-CM | POA: Diagnosis not present

## 2023-12-20 DIAGNOSIS — M5136 Other intervertebral disc degeneration, lumbar region with discogenic back pain only: Secondary | ICD-10-CM | POA: Diagnosis not present

## 2023-12-20 DIAGNOSIS — M9905 Segmental and somatic dysfunction of pelvic region: Secondary | ICD-10-CM | POA: Diagnosis not present

## 2023-12-20 DIAGNOSIS — M9903 Segmental and somatic dysfunction of lumbar region: Secondary | ICD-10-CM | POA: Diagnosis not present

## 2023-12-20 DIAGNOSIS — M955 Acquired deformity of pelvis: Secondary | ICD-10-CM | POA: Diagnosis not present

## 2024-01-10 DIAGNOSIS — M5136 Other intervertebral disc degeneration, lumbar region with discogenic back pain only: Secondary | ICD-10-CM | POA: Diagnosis not present

## 2024-01-10 DIAGNOSIS — M9903 Segmental and somatic dysfunction of lumbar region: Secondary | ICD-10-CM | POA: Diagnosis not present

## 2024-01-10 DIAGNOSIS — M955 Acquired deformity of pelvis: Secondary | ICD-10-CM | POA: Diagnosis not present

## 2024-01-10 DIAGNOSIS — M9905 Segmental and somatic dysfunction of pelvic region: Secondary | ICD-10-CM | POA: Diagnosis not present

## 2024-01-31 DIAGNOSIS — M9903 Segmental and somatic dysfunction of lumbar region: Secondary | ICD-10-CM | POA: Diagnosis not present

## 2024-01-31 DIAGNOSIS — M5136 Other intervertebral disc degeneration, lumbar region with discogenic back pain only: Secondary | ICD-10-CM | POA: Diagnosis not present

## 2024-01-31 DIAGNOSIS — M9905 Segmental and somatic dysfunction of pelvic region: Secondary | ICD-10-CM | POA: Diagnosis not present

## 2024-01-31 DIAGNOSIS — M955 Acquired deformity of pelvis: Secondary | ICD-10-CM | POA: Diagnosis not present

## 2024-02-21 DIAGNOSIS — M9903 Segmental and somatic dysfunction of lumbar region: Secondary | ICD-10-CM | POA: Diagnosis not present

## 2024-02-21 DIAGNOSIS — M5136 Other intervertebral disc degeneration, lumbar region with discogenic back pain only: Secondary | ICD-10-CM | POA: Diagnosis not present

## 2024-02-21 DIAGNOSIS — M9905 Segmental and somatic dysfunction of pelvic region: Secondary | ICD-10-CM | POA: Diagnosis not present

## 2024-02-21 DIAGNOSIS — M955 Acquired deformity of pelvis: Secondary | ICD-10-CM | POA: Diagnosis not present

## 2024-02-28 DIAGNOSIS — M9905 Segmental and somatic dysfunction of pelvic region: Secondary | ICD-10-CM | POA: Diagnosis not present

## 2024-02-28 DIAGNOSIS — M5136 Other intervertebral disc degeneration, lumbar region with discogenic back pain only: Secondary | ICD-10-CM | POA: Diagnosis not present

## 2024-02-28 DIAGNOSIS — M9903 Segmental and somatic dysfunction of lumbar region: Secondary | ICD-10-CM | POA: Diagnosis not present

## 2024-02-28 DIAGNOSIS — M955 Acquired deformity of pelvis: Secondary | ICD-10-CM | POA: Diagnosis not present

## 2024-03-20 DIAGNOSIS — M9905 Segmental and somatic dysfunction of pelvic region: Secondary | ICD-10-CM | POA: Diagnosis not present

## 2024-03-20 DIAGNOSIS — M955 Acquired deformity of pelvis: Secondary | ICD-10-CM | POA: Diagnosis not present

## 2024-03-20 DIAGNOSIS — M9903 Segmental and somatic dysfunction of lumbar region: Secondary | ICD-10-CM | POA: Diagnosis not present

## 2024-03-20 DIAGNOSIS — M5136 Other intervertebral disc degeneration, lumbar region with discogenic back pain only: Secondary | ICD-10-CM | POA: Diagnosis not present

## 2024-03-21 DIAGNOSIS — K219 Gastro-esophageal reflux disease without esophagitis: Secondary | ICD-10-CM | POA: Diagnosis not present

## 2024-03-21 DIAGNOSIS — Z0000 Encounter for general adult medical examination without abnormal findings: Secondary | ICD-10-CM | POA: Diagnosis not present

## 2024-03-21 DIAGNOSIS — N390 Urinary tract infection, site not specified: Secondary | ICD-10-CM | POA: Diagnosis not present

## 2024-03-21 DIAGNOSIS — E7800 Pure hypercholesterolemia, unspecified: Secondary | ICD-10-CM | POA: Diagnosis not present

## 2024-03-21 DIAGNOSIS — I872 Venous insufficiency (chronic) (peripheral): Secondary | ICD-10-CM | POA: Diagnosis not present

## 2024-03-21 DIAGNOSIS — I10 Essential (primary) hypertension: Secondary | ICD-10-CM | POA: Diagnosis not present

## 2024-03-21 DIAGNOSIS — N183 Chronic kidney disease, stage 3 unspecified: Secondary | ICD-10-CM | POA: Diagnosis not present

## 2024-04-11 DIAGNOSIS — J069 Acute upper respiratory infection, unspecified: Secondary | ICD-10-CM | POA: Diagnosis not present

## 2024-04-17 DIAGNOSIS — M955 Acquired deformity of pelvis: Secondary | ICD-10-CM | POA: Diagnosis not present

## 2024-04-17 DIAGNOSIS — M5136 Other intervertebral disc degeneration, lumbar region with discogenic back pain only: Secondary | ICD-10-CM | POA: Diagnosis not present

## 2024-04-17 DIAGNOSIS — M9905 Segmental and somatic dysfunction of pelvic region: Secondary | ICD-10-CM | POA: Diagnosis not present

## 2024-04-17 DIAGNOSIS — M9903 Segmental and somatic dysfunction of lumbar region: Secondary | ICD-10-CM | POA: Diagnosis not present

## 2024-04-24 DIAGNOSIS — M955 Acquired deformity of pelvis: Secondary | ICD-10-CM | POA: Diagnosis not present

## 2024-04-24 DIAGNOSIS — M5136 Other intervertebral disc degeneration, lumbar region with discogenic back pain only: Secondary | ICD-10-CM | POA: Diagnosis not present

## 2024-04-24 DIAGNOSIS — M9903 Segmental and somatic dysfunction of lumbar region: Secondary | ICD-10-CM | POA: Diagnosis not present

## 2024-04-24 DIAGNOSIS — M9905 Segmental and somatic dysfunction of pelvic region: Secondary | ICD-10-CM | POA: Diagnosis not present

## 2024-05-02 DIAGNOSIS — N183 Chronic kidney disease, stage 3 unspecified: Secondary | ICD-10-CM | POA: Diagnosis not present

## 2024-05-02 DIAGNOSIS — I10 Essential (primary) hypertension: Secondary | ICD-10-CM | POA: Diagnosis not present

## 2024-05-05 DIAGNOSIS — N183 Chronic kidney disease, stage 3 unspecified: Secondary | ICD-10-CM | POA: Diagnosis not present

## 2024-05-05 DIAGNOSIS — I10 Essential (primary) hypertension: Secondary | ICD-10-CM | POA: Diagnosis not present

## 2024-05-05 DIAGNOSIS — E7800 Pure hypercholesterolemia, unspecified: Secondary | ICD-10-CM | POA: Diagnosis not present

## 2024-05-08 DIAGNOSIS — M955 Acquired deformity of pelvis: Secondary | ICD-10-CM | POA: Diagnosis not present

## 2024-05-08 DIAGNOSIS — M9905 Segmental and somatic dysfunction of pelvic region: Secondary | ICD-10-CM | POA: Diagnosis not present

## 2024-05-08 DIAGNOSIS — M5136 Other intervertebral disc degeneration, lumbar region with discogenic back pain only: Secondary | ICD-10-CM | POA: Diagnosis not present

## 2024-05-08 DIAGNOSIS — M9903 Segmental and somatic dysfunction of lumbar region: Secondary | ICD-10-CM | POA: Diagnosis not present

## 2024-05-29 DIAGNOSIS — M9905 Segmental and somatic dysfunction of pelvic region: Secondary | ICD-10-CM | POA: Diagnosis not present

## 2024-05-29 DIAGNOSIS — M9903 Segmental and somatic dysfunction of lumbar region: Secondary | ICD-10-CM | POA: Diagnosis not present

## 2024-05-29 DIAGNOSIS — M955 Acquired deformity of pelvis: Secondary | ICD-10-CM | POA: Diagnosis not present

## 2024-05-29 DIAGNOSIS — M5136 Other intervertebral disc degeneration, lumbar region with discogenic back pain only: Secondary | ICD-10-CM | POA: Diagnosis not present

## 2024-05-31 DIAGNOSIS — N183 Chronic kidney disease, stage 3 unspecified: Secondary | ICD-10-CM | POA: Diagnosis not present

## 2024-05-31 DIAGNOSIS — I10 Essential (primary) hypertension: Secondary | ICD-10-CM | POA: Diagnosis not present

## 2024-06-04 DIAGNOSIS — N183 Chronic kidney disease, stage 3 unspecified: Secondary | ICD-10-CM | POA: Diagnosis not present

## 2024-06-04 DIAGNOSIS — I10 Essential (primary) hypertension: Secondary | ICD-10-CM | POA: Diagnosis not present

## 2024-06-04 DIAGNOSIS — E7800 Pure hypercholesterolemia, unspecified: Secondary | ICD-10-CM | POA: Diagnosis not present

## 2024-06-19 DIAGNOSIS — M955 Acquired deformity of pelvis: Secondary | ICD-10-CM | POA: Diagnosis not present

## 2024-06-19 DIAGNOSIS — M5136 Other intervertebral disc degeneration, lumbar region with discogenic back pain only: Secondary | ICD-10-CM | POA: Diagnosis not present

## 2024-06-19 DIAGNOSIS — M9903 Segmental and somatic dysfunction of lumbar region: Secondary | ICD-10-CM | POA: Diagnosis not present

## 2024-06-19 DIAGNOSIS — M9905 Segmental and somatic dysfunction of pelvic region: Secondary | ICD-10-CM | POA: Diagnosis not present

## 2024-06-20 ENCOUNTER — Emergency Department

## 2024-06-20 ENCOUNTER — Other Ambulatory Visit: Payer: Self-pay

## 2024-06-20 ENCOUNTER — Encounter: Payer: Self-pay | Admitting: Emergency Medicine

## 2024-06-20 ENCOUNTER — Emergency Department: Admission: EM | Admit: 2024-06-20 | Discharge: 2024-06-20 | Disposition: A | Discharge status: Home / Self Care

## 2024-06-20 DIAGNOSIS — M79622 Pain in left upper arm: Secondary | ICD-10-CM | POA: Diagnosis not present

## 2024-06-20 DIAGNOSIS — S0990XD Unspecified injury of head, subsequent encounter: Secondary | ICD-10-CM | POA: Diagnosis not present

## 2024-06-20 DIAGNOSIS — N189 Chronic kidney disease, unspecified: Secondary | ICD-10-CM | POA: Diagnosis not present

## 2024-06-20 DIAGNOSIS — M79671 Pain in right foot: Secondary | ICD-10-CM | POA: Diagnosis not present

## 2024-06-20 DIAGNOSIS — I6782 Cerebral ischemia: Secondary | ICD-10-CM | POA: Diagnosis not present

## 2024-06-20 DIAGNOSIS — M25551 Pain in right hip: Secondary | ICD-10-CM | POA: Insufficient documentation

## 2024-06-20 DIAGNOSIS — E871 Hypo-osmolality and hyponatremia: Secondary | ICD-10-CM | POA: Insufficient documentation

## 2024-06-20 DIAGNOSIS — M25521 Pain in right elbow: Secondary | ICD-10-CM | POA: Insufficient documentation

## 2024-06-20 DIAGNOSIS — M8580 Other specified disorders of bone density and structure, unspecified site: Secondary | ICD-10-CM | POA: Diagnosis not present

## 2024-06-20 DIAGNOSIS — W19XXXA Unspecified fall, initial encounter: Secondary | ICD-10-CM

## 2024-06-20 DIAGNOSIS — R42 Dizziness and giddiness: Secondary | ICD-10-CM | POA: Diagnosis not present

## 2024-06-20 DIAGNOSIS — W01198A Fall on same level from slipping, tripping and stumbling with subsequent striking against other object, initial encounter: Secondary | ICD-10-CM | POA: Insufficient documentation

## 2024-06-20 DIAGNOSIS — I129 Hypertensive chronic kidney disease with stage 1 through stage 4 chronic kidney disease, or unspecified chronic kidney disease: Secondary | ICD-10-CM | POA: Insufficient documentation

## 2024-06-20 LAB — COMPREHENSIVE METABOLIC PANEL WITH GFR
ALT: 20 U/L (ref 0–44)
AST: 27 U/L (ref 15–41)
Albumin: 4.1 g/dL (ref 3.5–5.0)
Alkaline Phosphatase: 118 U/L (ref 38–126)
Anion gap: 8 (ref 5–15)
BUN: 13 mg/dL (ref 8–23)
CO2: 26 mmol/L (ref 22–32)
Calcium: 9.6 mg/dL (ref 8.9–10.3)
Chloride: 98 mmol/L (ref 98–111)
Creatinine, Ser: 1.03 mg/dL — ABNORMAL HIGH (ref 0.44–1.00)
GFR, Estimated: 53 mL/min — ABNORMAL LOW (ref >60)
Glucose, Bld: 117 mg/dL — ABNORMAL HIGH (ref 70–99)
Potassium: 5.1 mmol/L (ref 3.5–5.1)
Sodium: 132 mmol/L — ABNORMAL LOW (ref 135–145)
Total Bilirubin: 0.6 mg/dL (ref 0.0–1.2)
Total Protein: 7.5 g/dL (ref 6.5–8.1)

## 2024-06-20 LAB — CBC
HCT: 45.1 % (ref 36.0–46.0)
Hemoglobin: 14.5 g/dL (ref 12.0–15.0)
MCH: 28.3 pg (ref 26.0–34.0)
MCHC: 32.2 g/dL (ref 30.0–36.0)
MCV: 87.9 fL (ref 80.0–100.0)
Platelets: 257 K/uL (ref 150–400)
RBC: 5.13 MIL/uL — ABNORMAL HIGH (ref 3.87–5.11)
RDW: 14.1 % (ref 11.5–15.5)
WBC: 7.5 K/uL (ref 4.0–10.5)
nRBC: 0 % (ref 0.0–0.2)

## 2024-06-20 LAB — URINALYSIS, ROUTINE W REFLEX MICROSCOPIC
Bilirubin Urine: NEGATIVE
Glucose, UA: NEGATIVE mg/dL
Hgb urine dipstick: NEGATIVE
Ketones, ur: NEGATIVE mg/dL
Nitrite: NEGATIVE
Protein, ur: NEGATIVE mg/dL
Specific Gravity, Urine: 1.005 (ref 1.005–1.030)
pH: 7 (ref 5.0–8.0)

## 2024-06-20 MED ORDER — ACETAMINOPHEN 500 MG PO TABS: 1000.0000 mg | ORAL_TABLET | Freq: Once | ORAL | Status: DC

## 2024-06-20 MED ORDER — SODIUM CHLORIDE 0.9 % IV BOLUS
500.0000 mL | Freq: Once | INTRAVENOUS | Status: AC
  Administered 2024-06-20: 500 mL via INTRAVENOUS

## 2024-06-20 MED ORDER — MECLIZINE HCL 25 MG PO TABS
25.0000 mg | ORAL_TABLET | Freq: Once | ORAL | Status: AC
  Administered 2024-06-20: 25 mg via ORAL
  Filled 2024-06-20: qty 1

## 2024-06-20 MED ORDER — ONDANSETRON HCL 4 MG/2ML IJ SOLN
4.0000 mg | Freq: Once | INTRAMUSCULAR | Status: AC
  Administered 2024-06-20: 4 mg via INTRAVENOUS
  Filled 2024-06-20: qty 2

## 2024-06-20 MED ORDER — CIPROFLOXACIN HCL 500 MG PO TABS: 500.0000 mg | ORAL_TABLET | Freq: Two times a day (BID) | ORAL | 0 refills | Status: AC

## 2024-06-20 NOTE — Discharge Instructions (Addendum)
 Your evaluation in the emergency department was overall reassuring, though we did see evidence of a UTI.  I suspect this is contributing to your recent symptoms.  Based on your allergies, I have started you on a medication called ciprofloxacin  which appears to have tolerated well before.  You can take this for the next 3 days and hold your Keflex .  Please do follow-up with your primary care provider for reevaluation, and return to the emergency department with any new or worsening symptoms.

## 2024-06-20 NOTE — ED Triage Notes (Signed)
 Patient to ED via POV after a fall. Pt reports she bent over to pet a dog and when she stood up she fell back. PT states the fall occurred on 7/4. States she did hit her head but denies LOC. Denies blood thinners. From the fall c/o right elbow, right hip, right foot. C/o dizziness and off balance today.

## 2024-06-20 NOTE — ED Provider Notes (Signed)
 [facility] Provider Note    Event Date/Time   First MD Initiated Contact with Patient 06/20/24 1807     (approximate)   History   Fall  Patient to ED via POV after a fall. Pt reports she bent over to pet a dog and when she stood up she fell back. PT states the fall occurred on 7/4. States she did hit her head but denies LOC. Denies blood thinners. From the fall c/o right elbow, right hip, right foot. C/o dizziness and off balance today.     HPI Courtney  L Williamson is a 89 y.o. female PMH arthritis, right shoulder arthroplasty, hypertension, venous stasis, hyperlipidemia, recurrent UTI, GERD, CKD presents for evaluation after fall - Patient was on the porch in fourth July, bent down to pet a dog, and then on standing she felt lightheaded and saw stars.  Lost her balance and fell into a wall.  Did strike her head but no LOC.  Per neighbor is a nurse who evaluated her, patient opted not to come to the hospital.  Since then has been feeling persistently lightheaded and somewhat off balance.  Does have history of chronic UTIs and is on daily Keflex  suppression, does endorse some recent urinary frequency.  No fevers.  No chest pain or shortness of breath.   - Does have persistent pain in left upper arm and right elbow since her fall.  Has been ambulatory with her cane.  Lives alone.  Accompanied by her son.       Physical Exam   Triage Vital Signs: ED Triage Vitals  Encounter Vitals Group     BP 06/20/24 1633 (!) 161/77     Girls Systolic BP Percentile --      Girls Diastolic BP Percentile --      Boys Systolic BP Percentile --      Boys Diastolic BP Percentile --      Pulse Rate 06/20/24 1633 90     Resp 06/20/24 1633 19     Temp 06/20/24 1633 98.4 F (36.9 C)     Temp Source 06/20/24 1633 Oral     SpO2 06/20/24 1633 96 %     Weight 06/20/24 1634 170 lb (77.1 kg)     Height 06/20/24 1634 5' 4 (1.626 m)     Head Circumference --      Peak Flow --       Pain Score 06/20/24 1634 3     Pain Loc --      Pain Education --      Exclude from Growth Chart --     Most recent vital signs: Vitals:   06/20/24 1840 06/20/24 1845  BP:  (!) 145/75  Pulse: 71 67  Resp: 14 15  Temp:    SpO2: 99% 96%     General: Awake, no distress.  HEENT: Normocephalic, atraumatic Neck:  No midline tenderness CV:  Good peripheral perfusion. RRR, RP 2+ Resp:  Normal effort. CTAB Abd:  No distention. Nontender to deep palpation throughout Neuro:  Aox4, CN II-XII intact, FNF wnl, finger taps fast b/l, 5/5 strength in bilateral finger extension/grip, arm flexion/extension, EHL/FHL. BUE AG 10+ sec no drift, BLE AG 5+ sec no drift. Ambulates with steady gait. SILT. Negative Rhomberg. Other:  Bruise to medial aspect of left upper arm with persistent tenderness throughout left upper arm.  No deformity appreciated.  Good distal pulse.  Mild abrasion (healing) to right elbow, able to range though does have some  pain with palpation.  No effusion.   ED Results / Procedures / Treatments   Labs (all labs ordered are listed, but only abnormal results are displayed) Labs Reviewed  COMPREHENSIVE METABOLIC PANEL WITH GFR - Abnormal; Notable for the following components:      Result Value   Sodium 132 (*)    Glucose, Bld 117 (*)    Creatinine, Ser 1.03 (*)    GFR, Estimated 53 (*)    All other components within normal limits  CBC - Abnormal; Notable for the following components:   RBC 5.13 (*)    All other components within normal limits  URINALYSIS, ROUTINE W REFLEX MICROSCOPIC - Abnormal; Notable for the following components:   Color, Urine STRAW (*)    APPearance CLEAR (*)    Leukocytes,Ua MODERATE (*)    Bacteria, UA RARE (*)    All other components within normal limits  CBG MONITORING, ED     EKG  See ED course below.   RADIOLOGY Radiology interpreted by myself and radiology reports reviewed.  No acute pathology  identified.    PROCEDURES:  Critical Care performed: No  Procedures   MEDICATIONS ORDERED IN ED: Medications  acetaminophen  (TYLENOL ) tablet 1,000 mg (has no administration in time range)  sodium chloride  0.9 % bolus 500 mL (500 mLs Intravenous New Bag/Given 06/20/24 1954)  meclizine  (ANTIVERT ) tablet 25 mg (25 mg Oral Given 06/20/24 1953)  ondansetron  (ZOFRAN ) injection 4 mg (4 mg Intravenous Given 06/20/24 1954)     IMPRESSION / MDM / ASSESSMENT AND PLAN / ED COURSE  I reviewed the triage vital signs and the nursing notes.                              DDX/MDM/AP: Differential diagnosis includes, but is not limited to, appears patient had an orthostatic event that precipitated her fall, consider possibility of intracranial hemorrhage such as now chronic subdural contributing to her ongoing dizziness.  Do not suspect C-spine injury given we are 2 weeks out from event and has no prior or current complaints of neck pain.  Does also endorse some mild persistent headaches.  With regard to her ongoing left upper arm and right elbow pain, consider possibility of subacute fractures noted down--will screen with x-ray.  Consider possibility of underlying UTI, anemia, electrolyte abnormality.  Consider arrhythmia.  Plan: - Labs - IV fluid - Meclizine , Tylenol  - EKG - CT head - X-ray left humerus, right elbow - Reassess  Patient's presentation is most consistent with acute presentation with potential threat to life or bodily function.  The patient is on the cardiac monitor to evaluate for evidence of arrhythmia and/or significant heart rate changes.  ED course below.  Workup notable for UTI, others unremarkable.  Is on prophylactic Keflex  daily.  Per chart review, appears patient has tolerated ciprofloxacin  well in the past though otherwise has many allergic issues.  Will start on 3-day course of ciprofloxacin  for simple UTI, hold Keflex  in the interim, and plan for PMD follow-up.  Prefers  discharge home and to start antibiotics in the morning which I believe is reasonable.  ED return precautions in place.  Patient and family agree with plan.  Clinical Course as of 06/20/24 2130  Wed Jun 20, 2024  1847 CBC reviewed, overall unremarkable [MM]  1847 CMP with very mild hyponatremia and very mild bump in creatinine [MM]  1847 Ecg = sinus rhythm, rate 79, no gross ST  elevation, right bundle branch block present, some inferior T wave inversions that are similar to prior EKG from 2019. [MM]  2002 CTH: IMPRESSION: 1. No evidence of acute intracranial abnormality. 2. Mild chronic small vessel ischemic disease.   [MM]  2029 Urinalysis concerning for UTI, will treat [MM]    Clinical Course User Index [MM] Clarine Ozell LABOR, MD     FINAL CLINICAL IMPRESSION(S) / ED DIAGNOSES   Final diagnoses:  Fall, initial encounter  Dizziness     Rx / DC Orders   ED Discharge Orders          Ordered    ciprofloxacin  (CIPRO ) 500 MG tablet  2 times daily        06/20/24 2126             Note:  This document was prepared using Dragon voice recognition software and may include unintentional dictation errors.   Clarine Ozell LABOR, MD 06/20/24 2131

## 2024-06-28 DIAGNOSIS — N390 Urinary tract infection, site not specified: Secondary | ICD-10-CM | POA: Diagnosis not present

## 2024-06-28 DIAGNOSIS — I10 Essential (primary) hypertension: Secondary | ICD-10-CM | POA: Diagnosis not present

## 2024-06-28 DIAGNOSIS — R053 Chronic cough: Secondary | ICD-10-CM | POA: Diagnosis not present

## 2024-06-28 DIAGNOSIS — N183 Chronic kidney disease, stage 3 unspecified: Secondary | ICD-10-CM | POA: Diagnosis not present

## 2024-06-28 DIAGNOSIS — I872 Venous insufficiency (chronic) (peripheral): Secondary | ICD-10-CM | POA: Diagnosis not present

## 2024-06-28 DIAGNOSIS — S7001XD Contusion of right hip, subsequent encounter: Secondary | ICD-10-CM | POA: Diagnosis not present

## 2024-06-30 DIAGNOSIS — N183 Chronic kidney disease, stage 3 unspecified: Secondary | ICD-10-CM | POA: Diagnosis not present

## 2024-06-30 DIAGNOSIS — I10 Essential (primary) hypertension: Secondary | ICD-10-CM | POA: Diagnosis not present

## 2024-07-03 DIAGNOSIS — M9903 Segmental and somatic dysfunction of lumbar region: Secondary | ICD-10-CM | POA: Diagnosis not present

## 2024-07-03 DIAGNOSIS — M955 Acquired deformity of pelvis: Secondary | ICD-10-CM | POA: Diagnosis not present

## 2024-07-03 DIAGNOSIS — M9905 Segmental and somatic dysfunction of pelvic region: Secondary | ICD-10-CM | POA: Diagnosis not present

## 2024-07-03 DIAGNOSIS — M5136 Other intervertebral disc degeneration, lumbar region with discogenic back pain only: Secondary | ICD-10-CM | POA: Diagnosis not present

## 2024-07-05 DIAGNOSIS — I10 Essential (primary) hypertension: Secondary | ICD-10-CM | POA: Diagnosis not present

## 2024-07-05 DIAGNOSIS — N183 Chronic kidney disease, stage 3 unspecified: Secondary | ICD-10-CM | POA: Diagnosis not present

## 2024-07-05 DIAGNOSIS — E7800 Pure hypercholesterolemia, unspecified: Secondary | ICD-10-CM | POA: Diagnosis not present

## 2024-07-17 DIAGNOSIS — M9905 Segmental and somatic dysfunction of pelvic region: Secondary | ICD-10-CM | POA: Diagnosis not present

## 2024-07-17 DIAGNOSIS — M9903 Segmental and somatic dysfunction of lumbar region: Secondary | ICD-10-CM | POA: Diagnosis not present

## 2024-07-17 DIAGNOSIS — M5136 Other intervertebral disc degeneration, lumbar region with discogenic back pain only: Secondary | ICD-10-CM | POA: Diagnosis not present

## 2024-07-17 DIAGNOSIS — M955 Acquired deformity of pelvis: Secondary | ICD-10-CM | POA: Diagnosis not present

## 2024-07-30 DIAGNOSIS — I10 Essential (primary) hypertension: Secondary | ICD-10-CM | POA: Diagnosis not present

## 2024-07-30 DIAGNOSIS — N183 Chronic kidney disease, stage 3 unspecified: Secondary | ICD-10-CM | POA: Diagnosis not present

## 2024-07-31 DIAGNOSIS — M9903 Segmental and somatic dysfunction of lumbar region: Secondary | ICD-10-CM | POA: Diagnosis not present

## 2024-07-31 DIAGNOSIS — M5136 Other intervertebral disc degeneration, lumbar region with discogenic back pain only: Secondary | ICD-10-CM | POA: Diagnosis not present

## 2024-07-31 DIAGNOSIS — M955 Acquired deformity of pelvis: Secondary | ICD-10-CM | POA: Diagnosis not present

## 2024-07-31 DIAGNOSIS — M9905 Segmental and somatic dysfunction of pelvic region: Secondary | ICD-10-CM | POA: Diagnosis not present

## 2024-08-05 DIAGNOSIS — I10 Essential (primary) hypertension: Secondary | ICD-10-CM | POA: Diagnosis not present

## 2024-08-05 DIAGNOSIS — E7800 Pure hypercholesterolemia, unspecified: Secondary | ICD-10-CM | POA: Diagnosis not present

## 2024-08-05 DIAGNOSIS — N183 Chronic kidney disease, stage 3 unspecified: Secondary | ICD-10-CM | POA: Diagnosis not present

## 2024-08-14 DIAGNOSIS — M9905 Segmental and somatic dysfunction of pelvic region: Secondary | ICD-10-CM | POA: Diagnosis not present

## 2024-08-14 DIAGNOSIS — M5136 Other intervertebral disc degeneration, lumbar region with discogenic back pain only: Secondary | ICD-10-CM | POA: Diagnosis not present

## 2024-08-14 DIAGNOSIS — M9903 Segmental and somatic dysfunction of lumbar region: Secondary | ICD-10-CM | POA: Diagnosis not present

## 2024-08-14 DIAGNOSIS — M955 Acquired deformity of pelvis: Secondary | ICD-10-CM | POA: Diagnosis not present

## 2024-08-28 DIAGNOSIS — M955 Acquired deformity of pelvis: Secondary | ICD-10-CM | POA: Diagnosis not present

## 2024-08-28 DIAGNOSIS — M5136 Other intervertebral disc degeneration, lumbar region with discogenic back pain only: Secondary | ICD-10-CM | POA: Diagnosis not present

## 2024-08-28 DIAGNOSIS — M9903 Segmental and somatic dysfunction of lumbar region: Secondary | ICD-10-CM | POA: Diagnosis not present

## 2024-08-28 DIAGNOSIS — M9905 Segmental and somatic dysfunction of pelvic region: Secondary | ICD-10-CM | POA: Diagnosis not present

## 2024-08-29 DIAGNOSIS — N183 Chronic kidney disease, stage 3 unspecified: Secondary | ICD-10-CM | POA: Diagnosis not present

## 2024-08-29 DIAGNOSIS — I10 Essential (primary) hypertension: Secondary | ICD-10-CM | POA: Diagnosis not present

## 2024-09-04 DIAGNOSIS — E7800 Pure hypercholesterolemia, unspecified: Secondary | ICD-10-CM | POA: Diagnosis not present

## 2024-09-04 DIAGNOSIS — N183 Chronic kidney disease, stage 3 unspecified: Secondary | ICD-10-CM | POA: Diagnosis not present

## 2024-09-04 DIAGNOSIS — I10 Essential (primary) hypertension: Secondary | ICD-10-CM | POA: Diagnosis not present

## 2024-09-11 DIAGNOSIS — M955 Acquired deformity of pelvis: Secondary | ICD-10-CM | POA: Diagnosis not present

## 2024-09-11 DIAGNOSIS — M9903 Segmental and somatic dysfunction of lumbar region: Secondary | ICD-10-CM | POA: Diagnosis not present

## 2024-09-11 DIAGNOSIS — M9905 Segmental and somatic dysfunction of pelvic region: Secondary | ICD-10-CM | POA: Diagnosis not present

## 2024-09-11 DIAGNOSIS — M5136 Other intervertebral disc degeneration, lumbar region with discogenic back pain only: Secondary | ICD-10-CM | POA: Diagnosis not present

## 2024-09-19 DIAGNOSIS — N183 Chronic kidney disease, stage 3 unspecified: Secondary | ICD-10-CM | POA: Diagnosis not present

## 2024-09-19 DIAGNOSIS — Z23 Encounter for immunization: Secondary | ICD-10-CM | POA: Diagnosis not present

## 2024-09-19 DIAGNOSIS — J301 Allergic rhinitis due to pollen: Secondary | ICD-10-CM | POA: Diagnosis not present

## 2024-09-19 DIAGNOSIS — L219 Seborrheic dermatitis, unspecified: Secondary | ICD-10-CM | POA: Diagnosis not present

## 2024-09-19 DIAGNOSIS — K219 Gastro-esophageal reflux disease without esophagitis: Secondary | ICD-10-CM | POA: Diagnosis not present

## 2024-09-19 DIAGNOSIS — I10 Essential (primary) hypertension: Secondary | ICD-10-CM | POA: Diagnosis not present

## 2024-09-19 DIAGNOSIS — N390 Urinary tract infection, site not specified: Secondary | ICD-10-CM | POA: Diagnosis not present

## 2024-09-19 DIAGNOSIS — E7800 Pure hypercholesterolemia, unspecified: Secondary | ICD-10-CM | POA: Diagnosis not present

## 2024-09-25 DIAGNOSIS — M5136 Other intervertebral disc degeneration, lumbar region with discogenic back pain only: Secondary | ICD-10-CM | POA: Diagnosis not present

## 2024-09-25 DIAGNOSIS — M955 Acquired deformity of pelvis: Secondary | ICD-10-CM | POA: Diagnosis not present

## 2024-09-25 DIAGNOSIS — M9905 Segmental and somatic dysfunction of pelvic region: Secondary | ICD-10-CM | POA: Diagnosis not present

## 2024-09-25 DIAGNOSIS — M9903 Segmental and somatic dysfunction of lumbar region: Secondary | ICD-10-CM | POA: Diagnosis not present

## 2024-09-28 DIAGNOSIS — N183 Chronic kidney disease, stage 3 unspecified: Secondary | ICD-10-CM | POA: Diagnosis not present

## 2024-10-05 DIAGNOSIS — I10 Essential (primary) hypertension: Secondary | ICD-10-CM | POA: Diagnosis not present

## 2024-10-05 DIAGNOSIS — N183 Chronic kidney disease, stage 3 unspecified: Secondary | ICD-10-CM | POA: Diagnosis not present

## 2024-10-05 DIAGNOSIS — E7800 Pure hypercholesterolemia, unspecified: Secondary | ICD-10-CM | POA: Diagnosis not present

## 2024-10-09 DIAGNOSIS — M5136 Other intervertebral disc degeneration, lumbar region with discogenic back pain only: Secondary | ICD-10-CM | POA: Diagnosis not present

## 2024-10-09 DIAGNOSIS — M955 Acquired deformity of pelvis: Secondary | ICD-10-CM | POA: Diagnosis not present

## 2024-10-09 DIAGNOSIS — M9903 Segmental and somatic dysfunction of lumbar region: Secondary | ICD-10-CM | POA: Diagnosis not present

## 2024-10-09 DIAGNOSIS — M9905 Segmental and somatic dysfunction of pelvic region: Secondary | ICD-10-CM | POA: Diagnosis not present

## 2024-10-23 DIAGNOSIS — M5136 Other intervertebral disc degeneration, lumbar region with discogenic back pain only: Secondary | ICD-10-CM | POA: Diagnosis not present

## 2024-10-23 DIAGNOSIS — M9903 Segmental and somatic dysfunction of lumbar region: Secondary | ICD-10-CM | POA: Diagnosis not present

## 2024-10-23 DIAGNOSIS — M955 Acquired deformity of pelvis: Secondary | ICD-10-CM | POA: Diagnosis not present

## 2024-10-23 DIAGNOSIS — M9905 Segmental and somatic dysfunction of pelvic region: Secondary | ICD-10-CM | POA: Diagnosis not present

## 2024-10-28 DIAGNOSIS — N183 Chronic kidney disease, stage 3 unspecified: Secondary | ICD-10-CM | POA: Diagnosis not present

## 2024-10-28 DIAGNOSIS — I10 Essential (primary) hypertension: Secondary | ICD-10-CM | POA: Diagnosis not present

## 2024-11-04 DIAGNOSIS — I10 Essential (primary) hypertension: Secondary | ICD-10-CM | POA: Diagnosis not present

## 2024-11-04 DIAGNOSIS — N183 Chronic kidney disease, stage 3 unspecified: Secondary | ICD-10-CM | POA: Diagnosis not present

## 2024-11-04 DIAGNOSIS — E7800 Pure hypercholesterolemia, unspecified: Secondary | ICD-10-CM | POA: Diagnosis not present

## 2024-11-06 DIAGNOSIS — M9903 Segmental and somatic dysfunction of lumbar region: Secondary | ICD-10-CM | POA: Diagnosis not present

## 2024-11-06 DIAGNOSIS — M9905 Segmental and somatic dysfunction of pelvic region: Secondary | ICD-10-CM | POA: Diagnosis not present

## 2024-11-06 DIAGNOSIS — M5136 Other intervertebral disc degeneration, lumbar region with discogenic back pain only: Secondary | ICD-10-CM | POA: Diagnosis not present

## 2024-11-06 DIAGNOSIS — M955 Acquired deformity of pelvis: Secondary | ICD-10-CM | POA: Diagnosis not present
